# Patient Record
Sex: Male | Born: 1960 | Race: White | Hispanic: No | Marital: Single
Health system: Southern US, Community
[De-identification: ages and names within clinical notes are randomized; demographics above are authoritative.]

## PROBLEM LIST (undated history)

## (undated) DIAGNOSIS — G7102 Facioscapulohumeral muscular dystrophy: Secondary | ICD-10-CM

---

## 2008-08-19 ENCOUNTER — Emergency Department (HOSPITAL_COMMUNITY): Admission: EM | Admit: 2008-08-19 | Discharge: 2008-08-19 | Payer: Self-pay | Admitting: Emergency Medicine

## 2008-08-19 ENCOUNTER — Ambulatory Visit: Payer: Self-pay | Admitting: Vascular Surgery

## 2008-08-19 ENCOUNTER — Encounter (INDEPENDENT_AMBULATORY_CARE_PROVIDER_SITE_OTHER): Payer: Self-pay | Admitting: Emergency Medicine

## 2009-05-12 ENCOUNTER — Inpatient Hospital Stay (HOSPITAL_COMMUNITY): Admission: EM | Admit: 2009-05-12 | Discharge: 2009-05-15 | Payer: Self-pay | Admitting: Emergency Medicine

## 2009-05-15 ENCOUNTER — Ambulatory Visit: Payer: Self-pay | Admitting: Vascular Surgery

## 2009-05-15 ENCOUNTER — Encounter (INDEPENDENT_AMBULATORY_CARE_PROVIDER_SITE_OTHER): Payer: Self-pay | Admitting: Emergency Medicine

## 2009-05-15 ENCOUNTER — Encounter (INDEPENDENT_AMBULATORY_CARE_PROVIDER_SITE_OTHER): Payer: Self-pay | Admitting: Surgery

## 2009-05-23 ENCOUNTER — Encounter (HOSPITAL_BASED_OUTPATIENT_CLINIC_OR_DEPARTMENT_OTHER): Admission: RE | Admit: 2009-05-23 | Discharge: 2009-07-31 | Payer: Self-pay | Admitting: Internal Medicine

## 2010-08-13 LAB — CULTURE, BLOOD (ROUTINE X 2)
Culture: NO GROWTH
Culture: NO GROWTH

## 2010-08-13 LAB — WOUND CULTURE: Gram Stain: NONE SEEN

## 2010-08-13 LAB — COMPREHENSIVE METABOLIC PANEL
ALT: 38 U/L (ref 0–53)
AST: 44 U/L — ABNORMAL HIGH (ref 0–37)
Albumin: 4.4 g/dL (ref 3.5–5.2)
Calcium: 9.1 mg/dL (ref 8.4–10.5)
Creatinine, Ser: <0.3 mg/dL — ABNORMAL LOW (ref 0.4–1.5)
Potassium: 3.7 mEq/L (ref 3.5–5.1)
Total Protein: 8.1 g/dL (ref 6.0–8.3)

## 2010-08-13 LAB — URINALYSIS, ROUTINE W REFLEX MICROSCOPIC
Bilirubin Urine: NEGATIVE
Glucose, UA: NEGATIVE mg/dL
Protein, ur: NEGATIVE mg/dL
Specific Gravity, Urine: 1.021 (ref 1.005–1.030)
Urobilinogen, UA: 0.2 mg/dL (ref 0.0–1.0)
pH: 5.5 (ref 5.0–8.0)

## 2010-08-13 LAB — CBC
MCHC: 34 g/dL (ref 30.0–36.0)
RBC: 4.81 MIL/uL (ref 4.22–5.81)
WBC: 8.9 10*3/uL (ref 4.0–10.5)

## 2010-08-13 LAB — DIFFERENTIAL
Basophils Absolute: 0.1 10*3/uL (ref 0.0–0.1)
Eosinophils Absolute: 0.1 10*3/uL (ref 0.0–0.7)
Eosinophils Relative: 1 % (ref 0–5)
Lymphocytes Relative: 19 % (ref 12–46)
Monocytes Absolute: 0.6 10*3/uL (ref 0.1–1.0)
Monocytes Relative: 6 % (ref 3–12)
Neutrophils Relative %: 73 % (ref 43–77)

## 2011-01-05 IMAGING — CR DG TIBIA/FIBULA 2V*R*
4 series · 4 of 4 positions shown · non-contrast
Comparison: None.

CLINICAL DATA: Infected sore in the medial right leg

RIGHT TIBIA AND FIBULA - 2 VIEW

[t tib/fib ap right (1 of 2)]
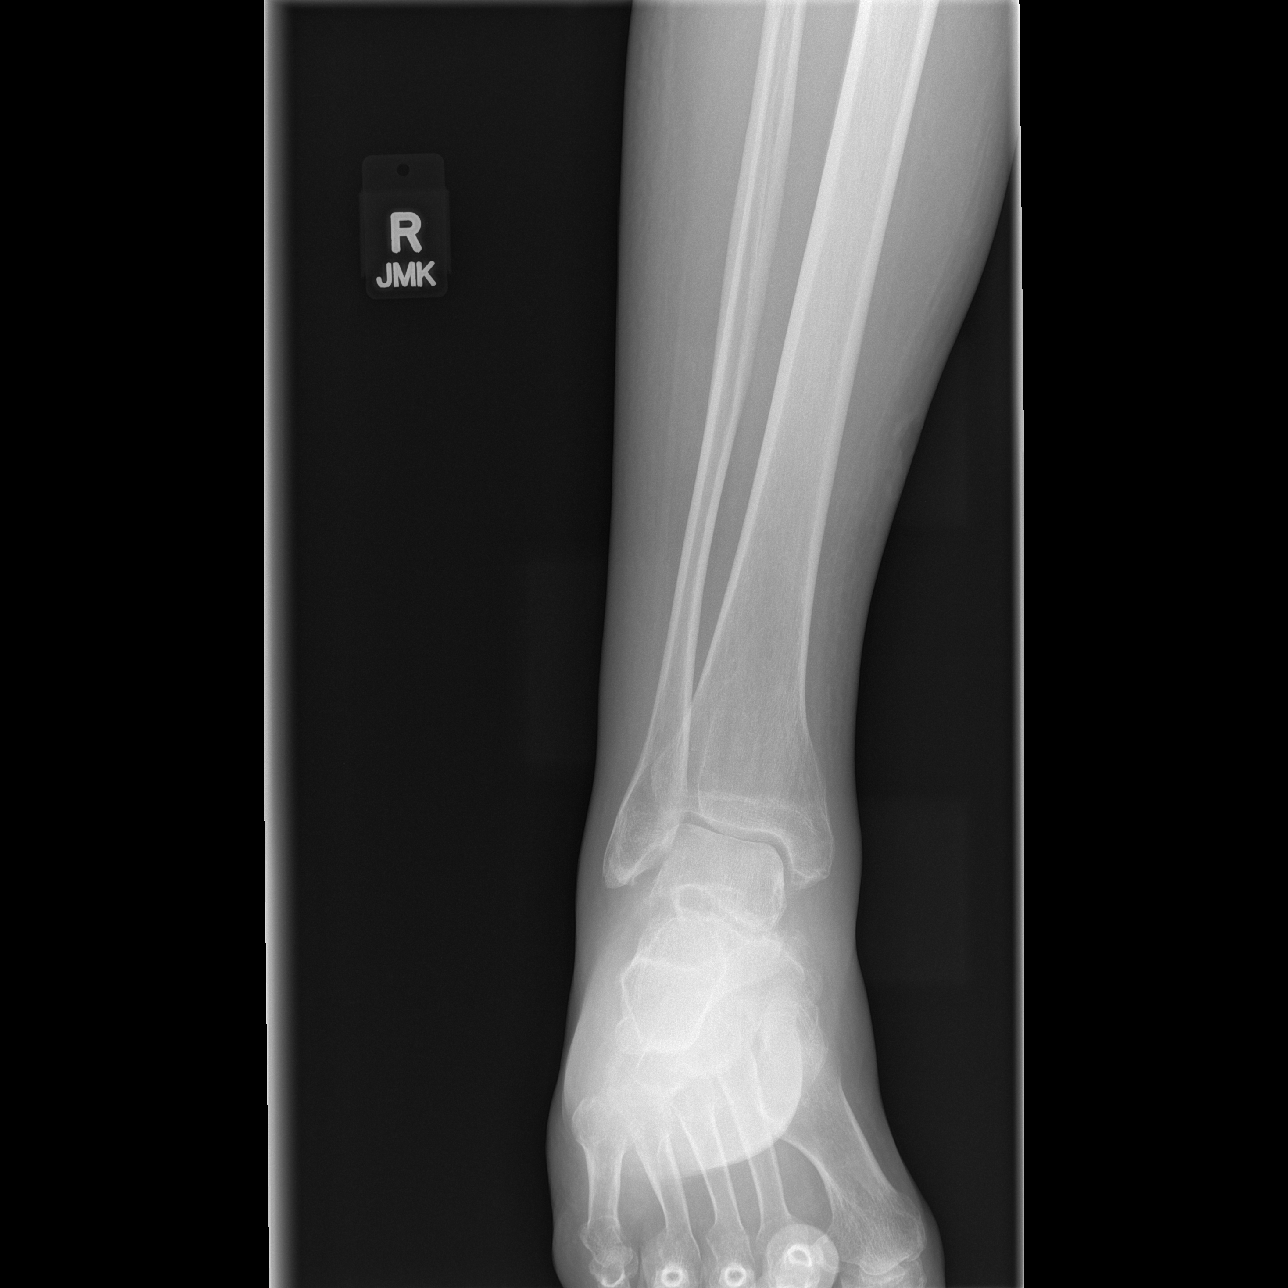

[t tib/fib ap right (2 of 2)]
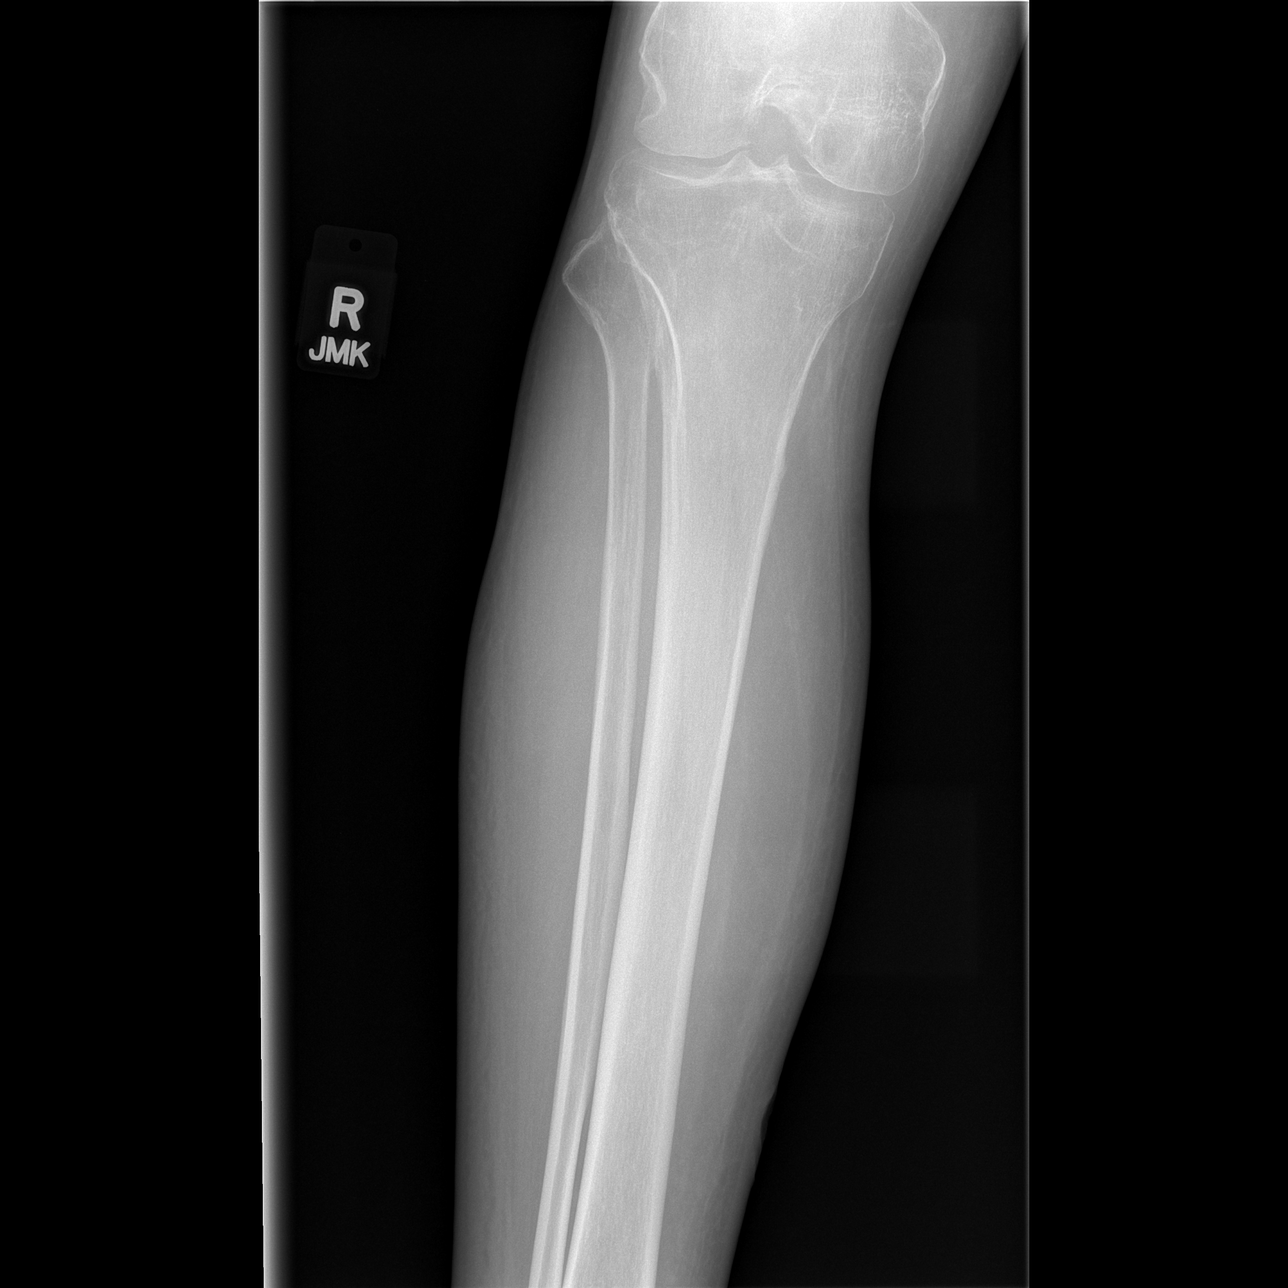

[t tib/fib lat right (1 of 2)]
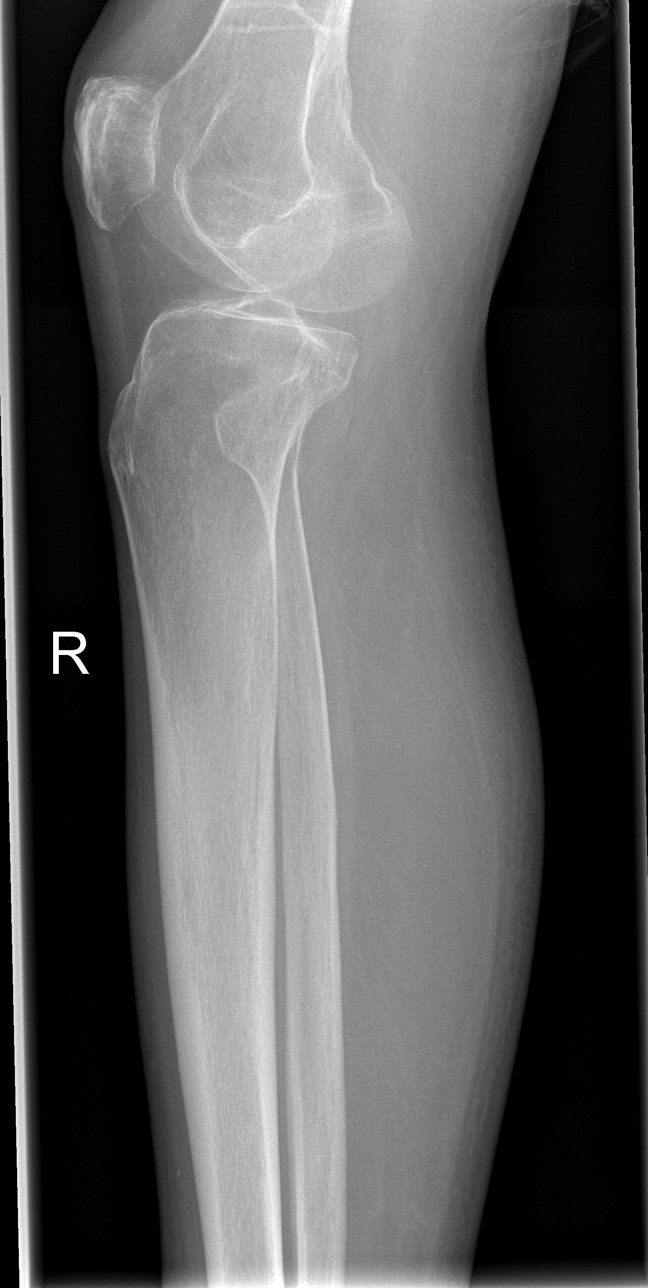

[t tib/fib lat right (2 of 2)]
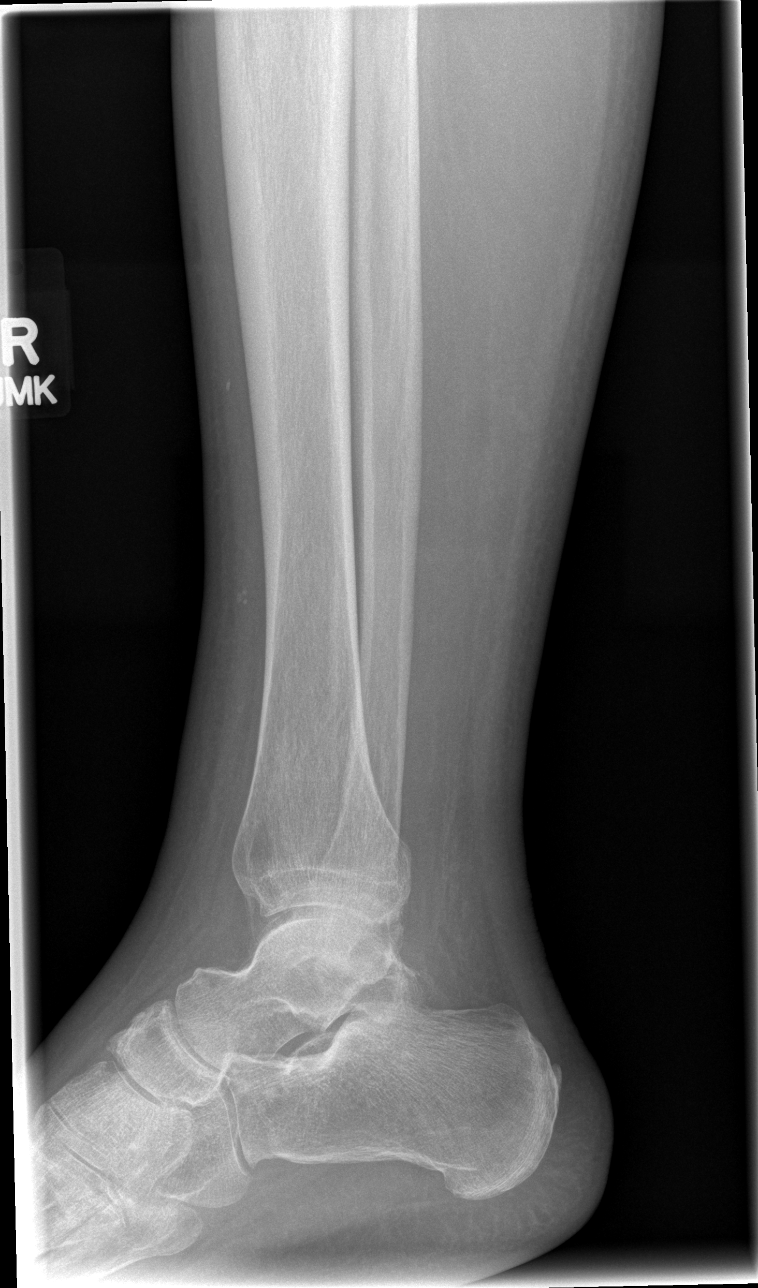

[4 of 4 positions shown; findings below may reference images not displayed]

FINDINGS: Two-view exam of the right tibia and fibula shows diffuse
osseous demineralization.  No lytic or sclerotic abnormality.  No
evidence for fracture.
IMPRESSION: No changes to suggest underlying osteomyelitis of the tibia or
fibula.

## 2019-04-28 ENCOUNTER — Inpatient Hospital Stay (HOSPITAL_COMMUNITY)
Admission: EM | Admit: 2019-04-28 | Discharge: 2019-05-14 | DRG: 871 | Disposition: E | Payer: Medicare Other | Attending: Critical Care Medicine | Admitting: Critical Care Medicine

## 2019-04-28 ENCOUNTER — Encounter (HOSPITAL_COMMUNITY): Payer: Self-pay | Admitting: Emergency Medicine

## 2019-04-28 ENCOUNTER — Other Ambulatory Visit: Payer: Self-pay

## 2019-04-28 ENCOUNTER — Emergency Department (HOSPITAL_COMMUNITY): Payer: Medicare Other

## 2019-04-28 DIAGNOSIS — E860 Dehydration: Secondary | ICD-10-CM | POA: Diagnosis present

## 2019-04-28 DIAGNOSIS — T68XXXA Hypothermia, initial encounter: Secondary | ICD-10-CM

## 2019-04-28 DIAGNOSIS — A419 Sepsis, unspecified organism: Secondary | ICD-10-CM | POA: Diagnosis not present

## 2019-04-28 DIAGNOSIS — R6520 Severe sepsis without septic shock: Secondary | ICD-10-CM | POA: Diagnosis not present

## 2019-04-28 DIAGNOSIS — Z6825 Body mass index (BMI) 25.0-25.9, adult: Secondary | ICD-10-CM

## 2019-04-28 DIAGNOSIS — I4891 Unspecified atrial fibrillation: Secondary | ICD-10-CM | POA: Diagnosis present

## 2019-04-28 DIAGNOSIS — D6959 Other secondary thrombocytopenia: Secondary | ICD-10-CM | POA: Diagnosis present

## 2019-04-28 DIAGNOSIS — J9612 Chronic respiratory failure with hypercapnia: Secondary | ICD-10-CM | POA: Diagnosis present

## 2019-04-28 DIAGNOSIS — L03116 Cellulitis of left lower limb: Secondary | ICD-10-CM | POA: Diagnosis present

## 2019-04-28 DIAGNOSIS — Z66 Do not resuscitate: Secondary | ICD-10-CM | POA: Diagnosis present

## 2019-04-28 DIAGNOSIS — Z515 Encounter for palliative care: Secondary | ICD-10-CM | POA: Diagnosis not present

## 2019-04-28 DIAGNOSIS — E162 Hypoglycemia, unspecified: Secondary | ICD-10-CM | POA: Diagnosis present

## 2019-04-28 DIAGNOSIS — E870 Hyperosmolality and hypernatremia: Secondary | ICD-10-CM | POA: Diagnosis present

## 2019-04-28 DIAGNOSIS — J189 Pneumonia, unspecified organism: Secondary | ICD-10-CM | POA: Diagnosis present

## 2019-04-28 DIAGNOSIS — N179 Acute kidney failure, unspecified: Secondary | ICD-10-CM | POA: Diagnosis present

## 2019-04-28 DIAGNOSIS — S80812A Abrasion, left lower leg, initial encounter: Secondary | ICD-10-CM | POA: Diagnosis present

## 2019-04-28 DIAGNOSIS — R627 Adult failure to thrive: Secondary | ICD-10-CM | POA: Diagnosis present

## 2019-04-28 DIAGNOSIS — R6521 Severe sepsis with septic shock: Secondary | ICD-10-CM | POA: Diagnosis present

## 2019-04-28 DIAGNOSIS — D696 Thrombocytopenia, unspecified: Secondary | ICD-10-CM

## 2019-04-28 DIAGNOSIS — N3000 Acute cystitis without hematuria: Secondary | ICD-10-CM | POA: Diagnosis present

## 2019-04-28 DIAGNOSIS — G7102 Facioscapulohumeral muscular dystrophy: Secondary | ICD-10-CM | POA: Diagnosis present

## 2019-04-28 DIAGNOSIS — E43 Unspecified severe protein-calorie malnutrition: Secondary | ICD-10-CM | POA: Diagnosis present

## 2019-04-28 DIAGNOSIS — Z20828 Contact with and (suspected) exposure to other viral communicable diseases: Secondary | ICD-10-CM | POA: Diagnosis present

## 2019-04-28 DIAGNOSIS — B952 Enterococcus as the cause of diseases classified elsewhere: Secondary | ICD-10-CM | POA: Diagnosis present

## 2019-04-28 DIAGNOSIS — R069 Unspecified abnormalities of breathing: Secondary | ICD-10-CM

## 2019-04-28 DIAGNOSIS — Z993 Dependence on wheelchair: Secondary | ICD-10-CM

## 2019-04-28 DIAGNOSIS — L03115 Cellulitis of right lower limb: Secondary | ICD-10-CM | POA: Diagnosis present

## 2019-04-28 DIAGNOSIS — J9601 Acute respiratory failure with hypoxia: Secondary | ICD-10-CM | POA: Diagnosis not present

## 2019-04-28 HISTORY — DX: Facioscapulohumeral muscular dystrophy: G71.02

## 2019-04-28 LAB — COMPREHENSIVE METABOLIC PANEL
ALT: 77 U/L — ABNORMAL HIGH (ref 0–44)
AST: 91 U/L — ABNORMAL HIGH (ref 15–41)
Albumin: 3.4 g/dL — ABNORMAL LOW (ref 3.5–5.0)
Alkaline Phosphatase: 166 U/L — ABNORMAL HIGH (ref 38–126)
Anion gap: 12 (ref 5–15)
BUN: 42 mg/dL — ABNORMAL HIGH (ref 6–20)
CO2: 33 mmol/L — ABNORMAL HIGH (ref 22–32)
Calcium: 10.1 mg/dL (ref 8.9–10.3)
Chloride: 104 mmol/L (ref 98–111)
Creatinine, Ser: 0.46 mg/dL — ABNORMAL LOW (ref 0.61–1.24)
GFR calc Af Amer: >60 mL/min (ref >60)
GFR calc non Af Amer: >60 mL/min (ref >60)
Glucose, Bld: 75 mg/dL (ref 70–99)
Potassium: 4.6 mmol/L (ref 3.5–5.1)
Sodium: 149 mmol/L — ABNORMAL HIGH (ref 135–145)
Total Bilirubin: 1.4 mg/dL — ABNORMAL HIGH (ref 0.3–1.2)
Total Protein: 7.7 g/dL (ref 6.5–8.1)

## 2019-04-28 LAB — CBC WITH DIFFERENTIAL/PLATELET
Abs Immature Granulocytes: 0.01 10*3/uL (ref 0.00–0.07)
Basophils Absolute: 0 10*3/uL (ref 0.0–0.1)
Basophils Relative: 0 %
Eosinophils Absolute: 0 10*3/uL (ref 0.0–0.5)
Eosinophils Relative: 0 %
HCT: 50.5 % (ref 39.0–52.0)
Hemoglobin: 15.5 g/dL (ref 13.0–17.0)
Immature Granulocytes: 0 %
Lymphocytes Relative: 10 %
Lymphs Abs: 0.4 10*3/uL — ABNORMAL LOW (ref 0.7–4.0)
MCH: 31.1 pg (ref 26.0–34.0)
MCHC: 30.7 g/dL (ref 30.0–36.0)
MCV: 101.2 fL — ABNORMAL HIGH (ref 80.0–100.0)
Monocytes Absolute: 0.3 10*3/uL (ref 0.1–1.0)
Monocytes Relative: 7 %
Neutro Abs: 3.7 10*3/uL (ref 1.7–7.7)
Neutrophils Relative %: 83 %
Platelets: 29 10*3/uL — CL (ref 150–400)
RBC: 4.99 MIL/uL (ref 4.22–5.81)
RDW: 18.6 % — ABNORMAL HIGH (ref 11.5–15.5)
WBC: 4.4 10*3/uL (ref 4.0–10.5)
nRBC: 0.5 % — ABNORMAL HIGH (ref 0.0–0.2)

## 2019-04-28 LAB — URINALYSIS, ROUTINE W REFLEX MICROSCOPIC
Bilirubin Urine: NEGATIVE
Glucose, UA: NEGATIVE mg/dL
Ketones, ur: NEGATIVE mg/dL
Nitrite: NEGATIVE
Protein, ur: 30 mg/dL — AB
Specific Gravity, Urine: 1.016 (ref 1.005–1.030)
pH: 5 (ref 5.0–8.0)

## 2019-04-28 LAB — TSH: TSH: 3.002 u[IU]/mL (ref 0.350–4.500)

## 2019-04-28 LAB — POC SARS CORONAVIRUS 2 AG -  ED: SARS Coronavirus 2 Ag: NEGATIVE

## 2019-04-28 LAB — PROTIME-INR
INR: 1 (ref 0.8–1.2)
Prothrombin Time: 12.7 seconds (ref 11.4–15.2)

## 2019-04-28 LAB — IMMATURE PLATELET FRACTION: Immature Platelet Fraction: 15 % — ABNORMAL HIGH (ref 1.2–8.6)

## 2019-04-28 LAB — LACTIC ACID, PLASMA: Lactic Acid, Venous: 1.4 mmol/L (ref 0.5–1.9)

## 2019-04-28 LAB — APTT: aPTT: 41 seconds — ABNORMAL HIGH (ref 24–36)

## 2019-04-28 LAB — CK: Total CK: 129 U/L (ref 49–397)

## 2019-04-28 MED ORDER — LACTATED RINGERS IV BOLUS (SEPSIS)
1000.0000 mL | Freq: Once | INTRAVENOUS | Status: AC
  Administered 2019-04-28: 18:00:00 1000 mL via INTRAVENOUS

## 2019-04-28 MED ORDER — IOHEXOL 300 MG/ML  SOLN
100.0000 mL | Freq: Once | INTRAMUSCULAR | Status: AC | PRN
  Administered 2019-04-28: 100 mL via INTRAVENOUS

## 2019-04-28 MED ORDER — SODIUM CHLORIDE 0.9 % IV SOLN
2.0000 g | Freq: Once | INTRAVENOUS | Status: AC
  Administered 2019-04-28: 2 g via INTRAVENOUS
  Filled 2019-04-28: qty 20

## 2019-04-28 MED ORDER — LACTATED RINGERS IV BOLUS (SEPSIS)
1000.0000 mL | Freq: Once | INTRAVENOUS | Status: AC
  Administered 2019-04-28: 1000 mL via INTRAVENOUS

## 2019-04-28 MED ORDER — LACTATED RINGERS IV BOLUS (SEPSIS)
500.0000 mL | Freq: Once | INTRAVENOUS | Status: AC
  Administered 2019-04-28: 500 mL via INTRAVENOUS

## 2019-04-28 MED ORDER — SODIUM CHLORIDE (PF) 0.9 % IJ SOLN
INTRAMUSCULAR | Status: AC
  Filled 2019-04-28: qty 50

## 2019-04-28 MED ORDER — LACTATED RINGERS IV BOLUS
1000.0000 mL | Freq: Once | INTRAVENOUS | Status: AC
  Administered 2019-04-28: 1000 mL via INTRAVENOUS

## 2019-04-28 MED ORDER — HYDROCORTISONE NA SUCCINATE PF 100 MG IJ SOLR
100.0000 mg | Freq: Once | INTRAMUSCULAR | Status: AC
  Administered 2019-04-28: 100 mg via INTRAVENOUS
  Filled 2019-04-28: qty 2

## 2019-04-28 MED ORDER — VANCOMYCIN HCL IN DEXTROSE 1-5 GM/200ML-% IV SOLN
1000.0000 mg | Freq: Once | INTRAVENOUS | Status: AC
  Administered 2019-04-28: 19:00:00 1000 mg via INTRAVENOUS
  Filled 2019-04-28: qty 200

## 2019-04-28 NOTE — ED Provider Notes (Signed)
[facility]-EMERGENCY DEPT Provider Note   CSN: [id number] Arrival date & time: 2019-05-05  1703     History Chief Complaint  Patient presents with  . Weakness    Dustin Wong is a 58 y.o. male.  HPI    Patient presents to the emergency room for evaluation of decreasing appetite and failure to thrive.  According to the EMS report the patient has advanced muscular dystrophy.  He is primarily chair bound.  According to family members the patient's had decreasing appetite.  He was looking more listless.  They called EMS.  EMS noted that the patient was hypotensive.  Patient appears is very ill and emaciated.  He does nod his head and answer my questions.  He denies having any pain.  He denies having any complaints.    past medical history: Advanced muscular dystrophy   History reviewed. No pertinent surgical history.     History reviewed. No pertinent family history.  Social History   Tobacco Use  . Smoking status: Not on file  Substance Use Topics  . Alcohol use: Not on file  . Drug use: Not on file    Home Medications Prior to Admission medications   Medication Sig Start Date End Date Taking? Authorizing Provider  Bioflavonoid Products (VITAMIN C PLUS) 500 MG TABS Take 500 mg by mouth daily.   Yes [provider]  cholecalciferol (VITAMIN D3) 25 MCG (1000 UT) tablet Take 1,000 Units by mouth daily.   Yes [provider]  furosemide (LASIX) 40 MG tablet Take 40 mg by mouth.   Yes [provider]  Multiple Vitamin (MULTIVITAMIN WITH MINERALS) TABS tablet Take 1 tablet by mouth daily.   Yes [provider]  potassium chloride (KLOR-CON) 10 MEQ tablet Take 10 mEq by mouth daily. 04/19/19  Yes [provider]    Allergies    Patient has no known allergies.  Review of Systems   Review of Systems  Unable to perform ROS: Acuity of condition    Physical Exam Updated Vital Signs BP (!) 83/56 (BP Location:  Right Arm)   Pulse 99   Temp (!) 90 F (32.2 C)   Resp (!) 29   SpO2 96%   Physical Exam Vitals and nursing note reviewed.  Constitutional:      Appearance: He is well-developed. He is ill-appearing.  HENT:     Head: Normocephalic and atraumatic.     Right Ear: External ear normal.     Left Ear: External ear normal.  Eyes:     General: No scleral icterus.       Right eye: No discharge.        Left eye: No discharge.     Conjunctiva/sclera: Conjunctivae normal.     Comments: Conjunctival injection bilaterally, eyes proptotic  Neck:     Trachea: No tracheal deviation.  Cardiovascular:     Rate and Rhythm: Normal rate and regular rhythm.  Pulmonary:     Effort: Pulmonary effort is normal. No respiratory distress.     Breath sounds: Normal breath sounds. No stridor. No wheezing or rales.  Abdominal:     General: Bowel sounds are normal. There is no distension.     Palpations: Abdomen is soft.     Tenderness: There is no abdominal tenderness. There is no guarding or rebound.     Comments: Firm ?mass lower abdomen  Genitourinary:    Penis: Normal.      Testes: Normal.  Musculoskeletal:  General: No tenderness.     Cervical back: Neck supple.     Right lower leg: Edema present.     Left lower leg: Edema present.     Comments: Significant edema bilateral lower extremities, weeping ulcerative wounds in the lower extremities with crusting on the skin, erythema of the bilateral lower extremities; edema extends into the buttocks  Skin:    General: Skin is warm and dry.     Findings: No rash.  Neurological:     Mental Status: He is alert.     Cranial Nerves: Cranial nerve deficit: Patient has difficulty smiling, unable to assess for facial droop, patient's not speaking to me when answer questions      Motor: Weakness, atrophy and abnormal muscle tone present. No seizure activity.     Coordination: Coordination normal.     Comments: Weakness in all 4 extremities     ED  Results / Procedures / Treatments   Labs (all labs ordered are listed, but only abnormal results are displayed) Labs Reviewed  COMPREHENSIVE METABOLIC PANEL - Abnormal; Notable for the following components:      Result Value   Sodium 149 (*)    CO2 33 (*)    BUN 42 (*)    Creatinine, Ser 0.46 (*)    Albumin 3.4 (*)    AST 91 (*)    ALT 77 (*)    Alkaline Phosphatase 166 (*)    Total Bilirubin 1.4 (*)    All other components within normal limits  CBC WITH DIFFERENTIAL/PLATELET - Abnormal; Notable for the following components:   MCV 101.2 (*)    RDW 18.6 (*)    Platelets 29 (*)    nRBC 0.5 (*)    Lymphs Abs 0.4 (*)    All other components within normal limits  APTT - Abnormal; Notable for the following components:   aPTT 41 (*)    All other components within normal limits  URINALYSIS, ROUTINE W REFLEX MICROSCOPIC - Abnormal; Notable for the following components:   Color, Urine AMBER (*)    APPearance HAZY (*)    Hgb urine dipstick MODERATE (*)    Protein, ur 30 (*)    Leukocytes,Ua LARGE (*)    Bacteria, UA FEW (*)    All other components within normal limits  IMMATURE PLATELET FRACTION - Abnormal; Notable for the following components:   Immature Platelet Fraction 15.0 (*)    All other components within normal limits  CULTURE, BLOOD (ROUTINE X 2)  CULTURE, BLOOD (ROUTINE X 2)  URINE CULTURE  SARS CORONAVIRUS 2 (TAT 6-24 HRS)  LACTIC ACID, PLASMA  PROTIME-INR  CK  TSH  LACTIC ACID, PLASMA  POC SARS CORONAVIRUS 2 AG -  ED    EKG None  Radiology CT ABDOMEN PELVIS W CONTRAST  Result Date: 04/24/2019 CLINICAL DATA:  Elevated LFTs, sepsis EXAM: CT ABDOMEN AND PELVIS WITH CONTRAST TECHNIQUE: Multidetector CT imaging of the abdomen and pelvis was performed using the standard protocol following bolus administration of intravenous contrast. CONTRAST:  100mL OMNIPAQUE IOHEXOL 300 MG/ML  SOLN COMPARISON:  None. FINDINGS: Lower chest: Airspace disease in the left lower lobe  concerning for pneumonia. Small right pleural effusion. Hepatobiliary: Diffuse low-density throughout the liver compatible with fatty infiltration. No focal abnormality. Gallbladder unremarkable. Pancreas: No focal abnormality or ductal dilatation. Spleen: No focal abnormality.  Normal size. Adrenals/Urinary Tract: The right upper pole collecting system is mildly prominent likely related to the staghorn calculus. Ureters are decompressed. Foley catheter in  place with urinary bladder decompressed. Stomach/Bowel: Stomach, large and small bowel grossly unremarkable. Vascular/Lymphatic: Aortic atherosclerosis. No enlarged abdominal or pelvic lymph nodes. Reproductive: No visible focal abnormality. Other: No free fluid or free air. Musculoskeletal: No acute bony abnormality. Advanced degenerative facet disease in the lumbar spine. IMPRESSION: Small right pleural effusion. Airspace disease in the left lower lobe concerning for pneumonia. Fatty infiltration of the liver. Right renal pelvic staghorn calculus. Caliectasis in the upper pole of the right kidney. Ureters are decompressed. Aortic atherosclerosis. Electronically Signed   By: Charlett Nose M.D.   On: 2019-05-25 20:09   DG Chest Port 1 View  Result Date: 05-25-2019 CLINICAL DATA:  Weakness EXAM: PORTABLE CHEST 1 VIEW COMPARISON:  None. FINDINGS: Heart size and vascularity normal. Lungs are clear. Patient rotated. Subluxation right shoulder may be chronic.  Shallow glenoid fossa. IMPRESSION: No active disease. Electronically Signed   By: Marlan Palau M.D.   On: 2019/05/25 18:37    Procedures .Critical Care Performed by: Linwood Dibbles, MD Authorized by: Linwood Dibbles, MD   Critical care provider statement:    Critical care time (minutes):  45   Critical care was time spent personally by me on the following activities:  Discussions with consultants, evaluation of patient's response to treatment, examination of patient, ordering and performing treatments and  interventions, ordering and review of laboratory studies, ordering and review of radiographic studies, pulse oximetry, re-evaluation of patient's condition, obtaining history from patient or surrogate and review of old charts   (including critical care time)  Medications Ordered in ED Medications  lactated ringers bolus 1,000 mL (0 mLs Intravenous Stopped 05-25-19 1905)    And  lactated ringers bolus 1,000 mL (1,000 mLs Intravenous New Bag/Given 05/25/19 1905)    And  lactated ringers bolus 500 mL (500 mLs Intravenous New Bag/Given 05/25/2019 1909)  vancomycin (VANCOCIN) IVPB 1000 mg/200 mL premix (0 mg Intravenous Stopped 25-May-2019 2040)  cefTRIAXone (ROCEPHIN) 2 g in sodium chloride 0.9 % 100 mL IVPB (0 g Intravenous Stopped May 25, 2019 1858)  sodium chloride (PF) 0.9 % injection (  Given by Other May 25, 2019 2007)  iohexol (OMNIPAQUE) 300 MG/ML solution 100 mL (100 mLs Intravenous Contrast Given 05/25/2019 1937)    ED Course  I have reviewed the triage vital signs and the nursing notes.  Pertinent labs & imaging results that were available during my care of the patient were reviewed by me and considered in my medical decision making (see chart for details).  Clinical Course as of Apr 27 2116  Wed 25-May-2019  1741 Attempted to contact patient contact.  No answer and unable to leave a message because the memory was full   [JK]  1858 Patient's initial CBC is notable for thrombocytopenia   [JK]  1859 Urinalysis consistent with a urinary tract infection.   [JK]  1859 Electrolyte panel notable for elevated LFTs as well as increased BUN and creatinine and hyponatremia.  Consistent with dehydration   [JK]  1933 Therapeutic range  TSH [JK]  2010 Patient remains hypothermic.  He is getting warmed IV fluids and is on a Lawyer.  Blood pressure remains borderline low.  Lactic acid levels not elevated but his symptoms are concerning for sepsis.   [JK]  2037 CT scan findings reviewed.  Total  source of intra-abdominal infection.  Possible pneumonia.   [JK]  2042 Albumin(!): 3.4 [JK]  2050 Discussed with PCCM, Dr Vaughan Basta, [facility] will consult in the ED to  determine appropriate admitting service.   [  JK]  2115 Disucssed with Dr Burr Medico.  Will consult on pt in the am   [JK]    Clinical Course User Index [JK] Dorie Rank, MD   MDM Rules/Calculators/A&P                     Patient has advanced muscular dystrophy.  On exam he had significantly edematous lower legs with serous fluid weeping and crusting debris bilaterally.  Patient's ED evaluation notable for urinary tract infection.  He had signs of sepsis with hypothermia and hypotension.  Lactic acid level was not elevated.  No signs of hypothyroidism based on his TSH.  Thrombocytopenia is also noted unclear what is the cause for this.  ?ITP.  Will consult with hematology.  Consult with PCCM vs hospitalist for admission. Final Clinical Impression(s) / ED Diagnoses Final diagnoses:  Sepsis, due to unspecified organism, unspecified whether acute organ dysfunction present ([place])  Hypothermia, initial encounter  Acute cystitis without hematuria      Dorie Rank, MD 04/19/2019 2117

## 2019-04-28 NOTE — ED Notes (Signed)
Patient's gold cross necklace is in locker. Key to locker and paperwork at patient bedside with clothing.

## 2019-04-28 NOTE — Consult Note (Addendum)
NAME:  Dustin Wong, MRN:  [id number], DOB:  [birth year], LOS: 0 ADMISSION DATE:  04/14/2019, CONSULTATION DATE:  04/24/2019 REFERRING MD:  ED, CHIEF COMPLAINT:  Altered mental status, poor appetitse  Brief History   58 year old man with muscular dystrophy (diagnosed in adulthood), here with FTT, acute worsening of mental status.  Weakness x 1 day, not able to work his wheelchair anymore, minimal urine output, no longer speaking, not eating.   History of present illness    Advanced muscular dystrophy, FTT, decreasing appetite, hypotensive.   Brought by family for decreasing appetite.   Actually lives alone, normally able to do simple things for himself, has mother and sister who are with him for a lot of the day.  Chronic LE edema, increasing erythema and weeping from BLE.   He does not like leaving his house, has resisted going to the doctor for a long time, per his sister.   Cr up to 0.46 from prior <0.3 (2010)  In ED has received 1.2 L bolus at the time of my exam.    Past Medical History  Muscular dystrophy BLE edema chronically  Significant Hospital Events     Consults:    Procedures:    Significant Diagnostic Tests:  CT chest and Abd: LLL consolidation  Micro Data:    Antimicrobials:  Ceftriaxone Vancomycin  Interim history/subjective:    Objective   Blood pressure (!) 83/56, pulse 99, temperature (!) 90 F (32.2 C), resp. rate (!) 29, SpO2 96 %.        Intake/Output Summary (Last 24 hours) at 05/10/2019 2111 Last data filed at 04/13/2019 2040 Gross per 24 hour  Intake 200 ml  Output --  Net 200 ml   There were no vitals filed for this visit.  Examination: General: Awake, non verbal, says yes and no.  HENT: erythematous film over both eyes. (chronic per sister),  Thin, erythematous lower lip,  Lungs: CTAB Cardiovascular: sinus tachycardic, frequent PACs Abdomen: NT,ND, NBS Extremities: severe B edema, up to knees, erythema, thickened skin,  multiple eschar and wounds in various stages of healing.  Neuro: awake, not speaking clearly (normally able to speak per sister) GU: purulent appearing urine in foley [facility] Problem list     Assessment & Plan:  Hypotension, AMS, weakness : sepsis.  Hypothermic. appears to have a UTI, as well as LLL PNA on CT.  Cont antibiotics.  No pressors needed at the time of my exam, responding to fluids.  Still appears dry on my exam.  Cont fluids. (100cc ns per hr) hypoalb 3.4 LFTs slightly up.  Thrombocytopenia - Heme consulting. PT and INR nl.  fibrinogen, retics, d dimer pending.  MD - progression of disease, FTT.  Suspect depression could be component of FTT.   Best practice:  Diet: NPO, can eat if passes swallow eval.   Pain/Anxiety/Delirium protocol (if indicated):   VAP protocol (if indicated):  DVT prophylaxis: none due to thrombocytopenia GI prophylaxis: ppi Glucose control: prn Mobility: bed.  Normally sits and sleeps in his wheelchair.  Code Status: Discussed with his sister.  She is not sure he would want cpr and intubation.  He is unable to clearly show that he understands and can make that decision.  Talked about risks vs benefits of mechanical vent and cpr.  I advised that DNR/DNI would be appropriate.  [name] will discuss with her mother and let us know tomorrow.  Family Communication: Sister [name] very pleasant and at bedside.  Disposition: ICU  Labs   CBC: Recent Labs  Lab 04/23/2019 1736  WBC 4.4  NEUTROABS 3.7  HGB 15.5  HCT 50.5  MCV 101.2*  PLT 29*    Basic Metabolic Panel: Recent Labs  Lab 04/15/2019 1736  NA 149*  K 4.6  CL 104  CO2 33*  GLUCOSE 75  BUN 42*  CREATININE 0.46*  CALCIUM 10.1   GFR: CrCl cannot be calculated (Unknown ideal weight.). Recent Labs  Lab 04/21/2019 1736  WBC 4.4  LATICACIDVEN 1.4    Liver Function Tests: Recent Labs  Lab 04/21/2019 1736  AST 91*  ALT 77*  ALKPHOS 166*  BILITOT 1.4*  PROT 7.7   ALBUMIN 3.4*   No results for input(s): LIPASE, AMYLASE in the last 168 hours. No results for input(s): AMMONIA in the last 168 hours.  ABG No results found for: PHART, PCO2ART, PO2ART, HCO3, TCO2, ACIDBASEDEF, O2SAT   Coagulation Profile: Recent Labs  Lab 05/09/2019 1736  INR 1.0    Cardiac Enzymes: Recent Labs  Lab 04/23/2019 1736  CKTOTAL 129    HbA1C: No results found for: HGBA1C  CBG: No results for input(s): GLUCAP in the last 168 hours.  Review of Systems:   Unable to assess, non verbal now  Past Medical History  He,  has no past medical history on file.   Surgical History   History reviewed. No pertinent surgical history.   Social History      Family History   His family history is not on file.   Allergies No Known Allergies   Home Medications  Prior to Admission medications   Medication Sig Start Date End Date Taking? Authorizing Provider  Bioflavonoid Products (VITAMIN C PLUS) 500 MG TABS Take 500 mg by mouth daily.   Yes [provider]  cholecalciferol (VITAMIN D3) 25 MCG (1000 UT) tablet Take 1,000 Units by mouth daily.   Yes [provider]  furosemide (LASIX) 40 MG tablet Take 40 mg by mouth.   Yes [provider]  Multiple Vitamin (MULTIVITAMIN WITH MINERALS) TABS tablet Take 1 tablet by mouth daily.   Yes [provider]  potassium chloride (KLOR-CON) 10 MEQ tablet Take 10 mEq by mouth daily. 04/19/19  Yes [provider]     Critical care time: 60 min

## 2019-04-28 NOTE — ED Notes (Signed)
Writer has spoken with mother Hiram Mciver and sister Daryus Sowash, Ms Daniel Nones is coming to the hospital to be with her brother. She will bring a hand written [facility] POA that pt wrote on Dec 12th although it has not been notarized along with his insurance information. She is aware to ask for Anderson Malta RN CN when she arrives.

## 2019-04-28 NOTE — ED Notes (Signed)
Per discussion with charge RN, patient's sister Henrietta Dine) will arrive and come back to see patient around 49.

## 2019-04-28 NOTE — Progress Notes (Signed)
A consult was received from an ED physician for vancomycin per pharmacy dosing.  The patient's profile has been reviewed for ht/wt/allergies/indication/available labs.    Order entered for ht/wt. No recent weight available in chart.   A one time order has been placed for vancomycin 1000 mg IV once.    Further antibiotics/pharmacy consults should be ordered by admitting physician if indicated.                       Thank you, Lenis Noon, PharmD 05/08/2019  5:57 PM

## 2019-04-28 NOTE — Sepsis Progress Note (Signed)
Notified bedside nurse of need to administer antibiotics.  Pt is difficult stick for blood cx. IV's do not draw back. One blood cx was obtained and she is hanging rocephin now for no further delay

## 2019-04-28 NOTE — ED Triage Notes (Signed)
Pt BIB EMS.  Pt from home with his sister (POA).  Advanced muscular dystrophy.  Family reports FTT.  Decreasing appetite.  Hypotensive en route.  This is the first time he has laid down in 4 years (reports family).  Pt follows commands.

## 2019-04-29 ENCOUNTER — Encounter (HOSPITAL_COMMUNITY): Payer: Self-pay | Admitting: Pulmonary Disease

## 2019-04-29 ENCOUNTER — Inpatient Hospital Stay (HOSPITAL_COMMUNITY): Payer: Medicare Other

## 2019-04-29 DIAGNOSIS — D696 Thrombocytopenia, unspecified: Secondary | ICD-10-CM | POA: Diagnosis not present

## 2019-04-29 DIAGNOSIS — E46 Unspecified protein-calorie malnutrition: Secondary | ICD-10-CM

## 2019-04-29 DIAGNOSIS — Z515 Encounter for palliative care: Secondary | ICD-10-CM | POA: Diagnosis not present

## 2019-04-29 DIAGNOSIS — D6959 Other secondary thrombocytopenia: Secondary | ICD-10-CM | POA: Diagnosis present

## 2019-04-29 DIAGNOSIS — E162 Hypoglycemia, unspecified: Secondary | ICD-10-CM | POA: Diagnosis present

## 2019-04-29 DIAGNOSIS — L03115 Cellulitis of right lower limb: Secondary | ICD-10-CM | POA: Diagnosis present

## 2019-04-29 DIAGNOSIS — J189 Pneumonia, unspecified organism: Secondary | ICD-10-CM | POA: Diagnosis present

## 2019-04-29 DIAGNOSIS — E870 Hyperosmolality and hypernatremia: Secondary | ICD-10-CM | POA: Diagnosis present

## 2019-04-29 DIAGNOSIS — G7100 Muscular dystrophy, unspecified: Secondary | ICD-10-CM

## 2019-04-29 DIAGNOSIS — E43 Unspecified severe protein-calorie malnutrition: Secondary | ICD-10-CM | POA: Diagnosis present

## 2019-04-29 DIAGNOSIS — L03116 Cellulitis of left lower limb: Secondary | ICD-10-CM | POA: Diagnosis present

## 2019-04-29 DIAGNOSIS — S80812A Abrasion, left lower leg, initial encounter: Secondary | ICD-10-CM | POA: Diagnosis present

## 2019-04-29 DIAGNOSIS — N179 Acute kidney failure, unspecified: Secondary | ICD-10-CM | POA: Diagnosis present

## 2019-04-29 DIAGNOSIS — B952 Enterococcus as the cause of diseases classified elsewhere: Secondary | ICD-10-CM | POA: Diagnosis present

## 2019-04-29 DIAGNOSIS — Z993 Dependence on wheelchair: Secondary | ICD-10-CM | POA: Diagnosis not present

## 2019-04-29 DIAGNOSIS — G7102 Facioscapulohumeral muscular dystrophy: Secondary | ICD-10-CM | POA: Diagnosis present

## 2019-04-29 DIAGNOSIS — R9431 Abnormal electrocardiogram [ECG] [EKG]: Secondary | ICD-10-CM

## 2019-04-29 DIAGNOSIS — R6521 Severe sepsis with septic shock: Secondary | ICD-10-CM | POA: Diagnosis present

## 2019-04-29 DIAGNOSIS — Z79899 Other long term (current) drug therapy: Secondary | ICD-10-CM

## 2019-04-29 DIAGNOSIS — R627 Adult failure to thrive: Secondary | ICD-10-CM | POA: Diagnosis present

## 2019-04-29 DIAGNOSIS — A419 Sepsis, unspecified organism: Secondary | ICD-10-CM | POA: Diagnosis present

## 2019-04-29 DIAGNOSIS — Z66 Do not resuscitate: Secondary | ICD-10-CM | POA: Diagnosis present

## 2019-04-29 DIAGNOSIS — I4891 Unspecified atrial fibrillation: Secondary | ICD-10-CM | POA: Diagnosis present

## 2019-04-29 DIAGNOSIS — Z6825 Body mass index (BMI) 25.0-25.9, adult: Secondary | ICD-10-CM | POA: Diagnosis not present

## 2019-04-29 DIAGNOSIS — R7989 Other specified abnormal findings of blood chemistry: Secondary | ICD-10-CM

## 2019-04-29 DIAGNOSIS — N3000 Acute cystitis without hematuria: Secondary | ICD-10-CM | POA: Diagnosis present

## 2019-04-29 DIAGNOSIS — Z20828 Contact with and (suspected) exposure to other viral communicable diseases: Secondary | ICD-10-CM | POA: Diagnosis present

## 2019-04-29 DIAGNOSIS — J9612 Chronic respiratory failure with hypercapnia: Secondary | ICD-10-CM | POA: Diagnosis present

## 2019-04-29 DIAGNOSIS — E860 Dehydration: Secondary | ICD-10-CM | POA: Diagnosis present

## 2019-04-29 DIAGNOSIS — J9601 Acute respiratory failure with hypoxia: Secondary | ICD-10-CM | POA: Diagnosis not present

## 2019-04-29 LAB — CBC WITH DIFFERENTIAL/PLATELET
Abs Immature Granulocytes: 0.01 10*3/uL (ref 0.00–0.07)
Basophils Absolute: 0 10*3/uL (ref 0.0–0.1)
Basophils Relative: 0 %
Eosinophils Absolute: 0 10*3/uL (ref 0.0–0.5)
Eosinophils Relative: 0 %
HCT: 41.1 % (ref 39.0–52.0)
Hemoglobin: 12.6 g/dL — ABNORMAL LOW (ref 13.0–17.0)
Immature Granulocytes: 0 %
Lymphocytes Relative: 6 %
Lymphs Abs: 0.3 10*3/uL — ABNORMAL LOW (ref 0.7–4.0)
MCH: 30.7 pg (ref 26.0–34.0)
MCHC: 30.7 g/dL (ref 30.0–36.0)
MCV: 100 fL (ref 80.0–100.0)
Monocytes Absolute: 0.4 10*3/uL (ref 0.1–1.0)
Monocytes Relative: 9 %
Neutro Abs: 4 10*3/uL (ref 1.7–7.7)
Neutrophils Relative %: 85 %
Platelets: 44 10*3/uL — ABNORMAL LOW (ref 150–400)
RBC: 4.11 MIL/uL — ABNORMAL LOW (ref 4.22–5.81)
RDW: 18 % — ABNORMAL HIGH (ref 11.5–15.5)
WBC: 4.7 10*3/uL (ref 4.0–10.5)
nRBC: 1.1 % — ABNORMAL HIGH (ref 0.0–0.2)

## 2019-04-29 LAB — BASIC METABOLIC PANEL
Anion gap: 15 (ref 5–15)
BUN: 39 mg/dL — ABNORMAL HIGH (ref 6–20)
CO2: 27 mmol/L (ref 22–32)
Calcium: 9.2 mg/dL (ref 8.9–10.3)
Chloride: 105 mmol/L (ref 98–111)
Creatinine, Ser: 0.37 mg/dL — ABNORMAL LOW (ref 0.61–1.24)
GFR calc Af Amer: >60 mL/min (ref >60)
GFR calc non Af Amer: >60 mL/min (ref >60)
Glucose, Bld: 41 mg/dL — CL (ref 70–99)
Potassium: 4.3 mmol/L (ref 3.5–5.1)
Sodium: 147 mmol/L — ABNORMAL HIGH (ref 135–145)

## 2019-04-29 LAB — FIBRINOGEN: Fibrinogen: 347 mg/dL (ref 210–475)

## 2019-04-29 LAB — GLUCOSE, CAPILLARY
Glucose-Capillary: 127 mg/dL — ABNORMAL HIGH (ref 70–99)
Glucose-Capillary: 80 mg/dL (ref 70–99)
Glucose-Capillary: 70 mg/dL (ref 70–99)
Glucose-Capillary: 82 mg/dL (ref 70–99)
Glucose-Capillary: 104 mg/dL — ABNORMAL HIGH (ref 70–99)
Glucose-Capillary: 107 mg/dL — ABNORMAL HIGH (ref 70–99)

## 2019-04-29 LAB — HEMOGLOBIN A1C
Hgb A1c MFr Bld: 5.3 % (ref 4.8–5.6)
Mean Plasma Glucose: 105.41 mg/dL

## 2019-04-29 LAB — SARS CORONAVIRUS 2 (TAT 6-24 HRS): SARS Coronavirus 2: NEGATIVE

## 2019-04-29 LAB — HIV ANTIBODY (ROUTINE TESTING W REFLEX): HIV Screen 4th Generation wRfx: NONREACTIVE

## 2019-04-29 LAB — RETICULOCYTES
Immature Retic Fract: 11.7 % (ref 2.3–15.9)
RBC.: 4.11 MIL/uL — ABNORMAL LOW (ref 4.22–5.81)
Retic Count, Absolute: 51.4 10*3/uL (ref 19.0–186.0)
Retic Ct Pct: 1.3 % (ref 0.4–3.1)

## 2019-04-29 LAB — PATHOLOGIST SMEAR REVIEW

## 2019-04-29 LAB — LACTIC ACID, PLASMA
Lactic Acid, Venous: 2 mmol/L (ref 0.5–1.9)
Lactic Acid, Venous: 1.4 mmol/L (ref 0.5–1.9)

## 2019-04-29 LAB — D-DIMER, QUANTITATIVE: D-Dimer, Quant: 0.95 ug/mL-FEU — ABNORMAL HIGH (ref 0.00–0.50)

## 2019-04-29 LAB — MAGNESIUM: Magnesium: 1.7 mg/dL (ref 1.7–2.4)

## 2019-04-29 LAB — PHOSPHORUS: Phosphorus: 3.3 mg/dL (ref 2.5–4.6)

## 2019-04-29 LAB — ECHOCARDIOGRAM COMPLETE
Height: 67 in
Weight: 2567.92 oz

## 2019-04-29 MED ORDER — EPINEPHRINE 1 MG/10ML IJ SOSY
PREFILLED_SYRINGE | INTRAMUSCULAR | Status: AC
  Administered 2019-04-30: 1 mg
  Filled 2019-04-29: qty 20

## 2019-04-29 MED ORDER — INSULIN ASPART 100 UNIT/ML ~~LOC~~ SOLN: 0.0000 [IU] | SUBCUTANEOUS | Status: DC

## 2019-04-29 MED ORDER — SODIUM CHLORIDE 0.9 % IV SOLN: INTRAVENOUS | Status: DC

## 2019-04-29 MED ORDER — CHLORHEXIDINE GLUCONATE 0.12 % MT SOLN
15.0000 mL | Freq: Two times a day (BID) | OROMUCOSAL | Status: DC
  Administered 2019-04-29: 15 mL via OROMUCOSAL

## 2019-04-29 MED ORDER — THIAMINE HCL 100 MG/ML IJ SOLN
100.0000 mg | Freq: Two times a day (BID) | INTRAMUSCULAR | Status: DC
  Administered 2019-04-29 (×2): 100 mg via INTRAVENOUS
  Filled 2019-04-29 (×2): qty 2

## 2019-04-29 MED ORDER — DEXTROSE 50 % IV SOLN
INTRAVENOUS | Status: AC
  Filled 2019-04-29: qty 50

## 2019-04-29 MED ORDER — PANTOPRAZOLE SODIUM 40 MG IV SOLR
40.0000 mg | Freq: Every day | INTRAVENOUS | Status: DC
  Administered 2019-04-29 (×2): 40 mg via INTRAVENOUS
  Filled 2019-04-29 (×2): qty 40

## 2019-04-29 MED ORDER — HYDROCERIN EX CREA
TOPICAL_CREAM | Freq: Every day | CUTANEOUS | Status: DC
  Filled 2019-04-29: qty 113

## 2019-04-29 MED ORDER — AMIODARONE LOAD VIA INFUSION
150.0000 mg | Freq: Once | INTRAVENOUS | Status: AC
  Administered 2019-04-29: 150 mg via INTRAVENOUS
  Filled 2019-04-29: qty 83.34

## 2019-04-29 MED ORDER — AMIODARONE HCL IN DEXTROSE 360-4.14 MG/200ML-% IV SOLN
30.0000 mg/h | INTRAVENOUS | Status: DC
  Administered 2019-04-29 (×2): 30 mg/h via INTRAVENOUS
  Filled 2019-04-29: qty 200

## 2019-04-29 MED ORDER — CHLORHEXIDINE GLUCONATE CLOTH 2 % EX PADS
6.0000 | MEDICATED_PAD | Freq: Every day | CUTANEOUS | Status: DC
  Administered 2019-04-29: 6 via TOPICAL

## 2019-04-29 MED ORDER — DEXTROSE 50 % IV SOLN
25.0000 g | Freq: Once | INTRAVENOUS | Status: AC
  Administered 2019-04-29: 25 g via INTRAVENOUS

## 2019-04-29 MED ORDER — ONDANSETRON HCL 4 MG/2ML IJ SOLN: 4.0000 mg | Freq: Four times a day (QID) | INTRAMUSCULAR | Status: DC | PRN

## 2019-04-29 MED ORDER — ORAL CARE MOUTH RINSE
15.0000 mL | Freq: Two times a day (BID) | OROMUCOSAL | Status: DC
  Administered 2019-04-29 (×2): 15 mL via OROMUCOSAL

## 2019-04-29 MED ORDER — PHENYLEPHRINE HCL-NACL 10-0.9 MG/250ML-% IV SOLN
0.0000 ug/min | INTRAVENOUS | Status: DC
  Administered 2019-04-29: 90 ug/min via INTRAVENOUS
  Administered 2019-04-29: 20 ug/min via INTRAVENOUS
  Administered 2019-04-29: 60 ug/min via INTRAVENOUS
  Filled 2019-04-29 (×3): qty 250

## 2019-04-29 MED ORDER — PHENYLEPHRINE HCL-NACL 10-0.9 MG/250ML-% IV SOLN
25.0000 ug/min | INTRAVENOUS | Status: DC
  Administered 2019-04-29: 90 ug/min via INTRAVENOUS
  Administered 2019-04-29: 70 ug/min via INTRAVENOUS
  Administered 2019-04-29: 140 ug/min via INTRAVENOUS
  Administered 2019-04-29: 70 ug/min via INTRAVENOUS
  Administered 2019-04-29: 90 ug/min via INTRAVENOUS
  Administered 2019-04-30 (×2): 200 ug/min via INTRAVENOUS
  Filled 2019-04-29 (×3): qty 250
  Filled 2019-04-29: qty 500
  Filled 2019-04-29 (×4): qty 250

## 2019-04-29 MED ORDER — SODIUM CHLORIDE 0.9 % IV SOLN: 250.0000 mL | INTRAVENOUS | Status: DC

## 2019-04-29 MED ORDER — MAGNESIUM SULFATE 2 GM/50ML IV SOLN
2.0000 g | Freq: Once | INTRAVENOUS | Status: AC
  Administered 2019-04-29: 2 g via INTRAVENOUS
  Filled 2019-04-29: qty 50

## 2019-04-29 MED ORDER — LACTATED RINGERS IV BOLUS
1000.0000 mL | Freq: Once | INTRAVENOUS | Status: AC
  Administered 2019-04-29: 1000 mL via INTRAVENOUS

## 2019-04-29 MED ORDER — AMIODARONE HCL IN DEXTROSE 360-4.14 MG/200ML-% IV SOLN
60.0000 mg/h | INTRAVENOUS | Status: AC
  Administered 2019-04-29 (×2): 60 mg/h via INTRAVENOUS
  Filled 2019-04-29 (×3): qty 200

## 2019-04-29 MED ORDER — SODIUM CHLORIDE 0.9 % IV SOLN
1.0000 g | INTRAVENOUS | Status: DC
  Administered 2019-04-29: 1 g via INTRAVENOUS
  Filled 2019-04-29: qty 1

## 2019-04-29 MED ORDER — DEXTROSE-NACL 5-0.9 % IV SOLN: INTRAVENOUS | Status: DC

## 2019-04-29 MED ORDER — VANCOMYCIN HCL IN DEXTROSE 1-5 GM/200ML-% IV SOLN
1000.0000 mg | Freq: Two times a day (BID) | INTRAVENOUS | Status: DC
  Administered 2019-04-29 (×2): 1000 mg via INTRAVENOUS
  Filled 2019-04-29 (×2): qty 200

## 2019-04-29 MED ORDER — CIPROFLOXACIN HCL 0.3 % OP SOLN
2.0000 [drp] | Freq: Two times a day (BID) | OPHTHALMIC | Status: DC
  Administered 2019-04-29 (×2): 2 [drp] via OPHTHALMIC
  Filled 2019-04-29: qty 2.5

## 2019-04-29 MED ORDER — ACETAMINOPHEN 325 MG PO TABS: 650.0000 mg | ORAL_TABLET | ORAL | Status: DC | PRN

## 2019-04-29 NOTE — Progress Notes (Addendum)
[facility] Progress Note Patient Name: Mohsin Crum DOB: 05-Feb-1961 MRN: [id number]   Date of Service  04/29/2019  HPI/Events of Note  Hypotension - BP = 64/42 with HR = 126 (AFIB with RVR)  eICU Interventions  Will order: 1. Phneylepherine IV infusion via PIV.      Intervention Category Major Interventions: Hypotension - evaluation and management  Kaisen Ackers [place] 04/29/2019, 2:03 AM

## 2019-04-29 NOTE — Consult Note (Addendum)
[place]  [phone] [phone]    [place]  Referring MD:  Dr. Dorie Rank  Reason for Referral: Thrombocytopenia  HPI: Mr. 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  He was brought to the emergency room due to altered mental status and poor appetite.  Family reported worsening weakness, mental status, and inability to work his wheelchair anymore.  Family also reported minimal urine output, no longer speaking, and no longer eating.  He has chronic lower extremity edema and noted to have cellulitis and open wounds on his lower extremities.  He was hypotensive and hypothermic in the emergency room and received warmed IV fluids and was placed on a Bair hugger.  CBC was significant for platelet count 29,000.  Immature platelet fraction was elevated at 15.0. CMET showed an elevated BUN 42 creatinine 0.46, elevated AST at 91, elevated ALT 77, alk phos 166 and total bilirubin 1.4.  Lactic acid was not elevated.  UA showed moderate hemoglobin, large amount of leukocytes, and few bacteria.  Blood cultures and urine cultures were obtained and are pending.  Chest x-ray showed no active disease.  CT the abdomen pelvis showed a small right pleural effusion, airspace disease in the left lower lobe concerning for pneumonia, fatty infiltration of the liver, and right renal pelvic staghorn calculus.  He was admitted to the ICU for sepsis from multiple sources including extensive cellulitis in the lower extremities and questionable pneumonia.  Most recent previous CBC available to me was performed on 05/12/2009 at that time, the patient had mild thrombocytopenia with a platelet count of 117,000.  Otherwise, his CBC was unremarkable.  When seen today, the patient is awake.  He is unable to speak to me.  He nod his head yes and no.  He currently denies pain.  He has not seen any  bleeding.  Unable to obtain a comprehensive review of systems from the patient.  No family at the bedside. Hematology was asked see the patient to make recommendations regarding his thrombocytopenia.  Past Medical History:  Diagnosis Date  . 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 No pertinent surgical history.:  CURRENT MEDS: Current Facility-Administered Medications  Medication Dose Route Frequency Provider Last Rate Last Admin  . 0.9 %  sodium chloride infusion  250 mL Intravenous Continuous Noemi Chapel P, DO      . acetaminophen (TYLENOL) tablet 650 mg  650 mg Oral Q4H PRN Collier Bullock, MD      . amiodarone (NEXTERONE PREMIX) 360-4.14 MG/200ML-% (1.8 mg/mL) IV infusion  30 mg/hr Intravenous Continuous Anders Simmonds, MD 16.67 mL/hr at 04/29/19 0900 30 mg/hr at 04/29/19 0900  . cefTRIAXone (ROCEPHIN) 1 g in sodium chloride 0.9 % 100 mL IVPB  1 g Intravenous Q24H Collier Bullock, MD      . chlorhexidine (PERIDEX) 0.12 % solution 15 mL  15 mL Mouth Rinse BID Collier Bullock, MD   15 mL at 04/29/19 0928  . Chlorhexidine Gluconate Cloth 2 % PADS 6 each  6 each Topical Daily Collier Bullock, MD   6 each at 04/29/19 [phone]  . ciprofloxacin (CILOXAN) 0.3 % ophthalmic solution 2 drop  2 drop Both Eyes BID Noemi Chapel P, DO   2 drop at 04/29/19 1139  . dextrose 5 %-0.9 % sodium chloride infusion   Intravenous Continuous Minor, Grace Bushy, NP      . hydrocerin (EUCERIN) cream  Topical Daily Julian Hy, DO   Given at 04/29/19 1141  . insulin aspart (novoLOG) injection 0-6 Units  0-6 Units Subcutaneous Q4H Minor, Grace Bushy, NP      . MEDLINE mouth rinse  15 mL Mouth Rinse q12n4p Collier Bullock, MD      . ondansetron [facility]) injection 4 mg  4 mg Intravenous Q6H PRN Collier Bullock, MD      . pantoprazole (PROTONIX) injection 40 mg  40 mg Intravenous QHS Collier Bullock, MD   40 mg at 04/29/19 0313  . phenylephrine (NEOSYNEPHRINE) 10-0.9 MG/250ML-%  infusion  25-200 mcg/min Intravenous Titrated Julian Hy, DO 135 mL/hr at 04/29/19 1138 90 mcg/min at 04/29/19 1138  . thiamine (B-1) injection 100 mg  100 mg Intravenous BID Noemi Chapel P, DO      . vancomycin (VANCOCIN) IVPB 1000 mg/200 mL premix  1,000 mg Intravenous Q12H Collier Bullock, MD   Stopped at 04/29/19 0703      No Known Allergies:  Family History  Problem Relation Age of Onset  . Hypertension Father   . Irregular heart beat Father   :  Social History   Socioeconomic History  . Marital status: Single    Spouse name: Not on file  . Number of children: Not on file  . Years of education: Not on file  . Highest education level: Not on file  Occupational History  . Not on file  Tobacco Use  . Smoking status: Not on file  Substance and Sexual Activity  . Alcohol use: Not on file  . Drug use: Not on file  . Sexual activity: Not on file  Other Topics Concern  . Not on file  Social History Narrative  . Not on file   Social Determinants of Health   Financial Resource Strain:   . Difficulty of Paying Living Expenses: Not on file  Food Insecurity:   . Worried About Charity fundraiser in the Last Year: Not on file  . Ran Out of Food in the Last Year: Not on file  Transportation Needs:   . Lack of Transportation (Medical): Not on file  . Lack of Transportation (Non-Medical): Not on file  Physical Activity:   . Days of Exercise per Week: Not on file  . Minutes of Exercise per Session: Not on file  Stress:   . Feeling of Stress : Not on file  Social Connections:   . Frequency of Communication with Friends and Family: Not on file  . Frequency of Social Gatherings with Friends and Family: Not on file  . Attends Religious Services: Not on file  . Active Member of Clubs or Organizations: Not on file  . Attends Archivist Meetings: Not on file  . Marital Status: Not on file  Intimate Partner Violence:   . Fear of Current or Ex-Partner: Not on file   . Emotionally Abused: Not on file  . Physically Abused: Not on file  . Sexually Abused: Not on file  :  REVIEW OF SYSTEMS: Unable to obtain a comprehensive review of systems from the patient.  See HPI for symptoms reported by family.  Exam: Patient Vitals for the past 24 hrs:  BP Temp Temp src Pulse Resp SpO2 Height Weight  04/29/19 0930 (!) 93/56 (!) 97.2 F (36.2 C) -- (!) 123 (!) 27 96 % -- --  04/29/19 0915 (!) 86/63 97.9 F (36.6 C) -- (!) 41 (!) 24 96 % -- --  04/29/19 0900 (!) 84/60  98.2 F (36.8 C) Bladder (!) 112 (!) 25 95 % -- --  04/29/19 0845 (!) 97/58 (!) 86.4 F (30.2 C) -- (!) 113 17 96 % -- --  04/29/19 0830 (!) 88/51 98.1 F (36.7 C) -- (!) 108 (!) 26 96 % -- --  04/29/19 0815 92/61 98.1 F (36.7 C) -- 96 (!) 27 95 % -- --  04/29/19 0800 (!) 84/50 98.1 F (36.7 C) Bladder (!) 101 20 96 % -- --  04/29/19 0745 (!) 89/60 97.9 F (36.6 C) -- (!) 114 (!) 27 96 % -- --  04/29/19 0730 (!) 80/44 97.7 F (36.5 C) -- (!) 115 20 96 % -- --  04/29/19 0700 (!) 96/53 (!) 97 F (36.1 C) Bladder (!) 116 13 94 % -- --  04/29/19 0630 (!) 90/58 (!) 96.6 F (35.9 C) Oral (!) 102 (!) 36 95 % -- --  04/29/19 0430 (!) 95/56 (!) 96.1 F (35.6 C) Axillary 79 (!) 28 96 % -- --  04/29/19 0400 91/64 (!) 96.3 F (35.7 C) Bladder (!) 110 (!) 27 98 % -- --  04/29/19 0345 (!) 94/54 (!) 96.6 F (35.9 C) Axillary (!) 116 (!) 33 96 % -- --  04/29/19 0340 (!) 91/56 (!) 96.4 F (35.8 C) -- 84 18 96 % -- --  04/29/19 0330 (!) 93/56 (!) 97.5 F (36.4 C) Axillary 94 15 95 % -- --  04/29/19 0300 (!) 70/42 (!) 97.5 F (36.4 C) Axillary (!) 116 (!) 29 94 % -- --  04/29/19 0100 (!) 89/64 98.6 F (37 C) -- (!) 130 (!) 34 94 % -- 160 lb 7.9 oz (72.8 kg)  04/29/19 0030 (!) 90/58 97.9 F (36.6 C) -- (!) 137 (!) 33 94 % -- --  04/29/19 0015 (!) 89/62 (!) 97.5 F (36.4 C) -- (!) 129 (!) 32 93 % -- --  04/29/19 0000 (!) 87/65 (!) 97.2 F (36.2 C) -- (!) 133 (!) 38 94 % -- --  04/27/2019 2352 -- --  -- -- -- -- '5\' 7"'  (1.702 m) 150 lb (68 kg)  04/26/2019 2345 90/69 (!) 96.6 F (35.9 C) -- (!) 133 (!) 0 94 % -- --  05/11/2019 2315 (!) 104/57 (!) 95.7 F (35.4 C) -- (!) 127 19 95 % -- --  05/05/2019 2300 (!) 88/63 (!) 95.2 F (35.1 C) -- (!) 123 12 95 % -- --  04/16/2019 2230 90/60 (!) 93.9 F (34.4 C) -- (!) 113 (!) 25 94 % -- --  04/18/2019 2158 -- (!) 92.6 F (33.7 C) Rectal -- -- -- -- --  05/10/2019 2130 (!) 81/54 (!) 91.2 F (32.9 C) -- 97 16 95 % -- --  04/27/2019 2115 (!) 79/52 (!) 90.5 F (32.5 C) -- 96 (!) 21 96 % -- --  04/14/2019 2100 (!) 83/56 (!) 90 F (32.2 C) -- 99 (!) 29 96 % -- --  04/24/2019 2045 (!) 82/56 (!) 89.2 F (31.8 C) -- 85 (!) 27 96 % -- --  05/07/2019 2000 90/73 (!) 88.2 F (31.2 C) -- 73 (!) 28 97 % -- --  05/02/2019 1955 -- (!) 88.6 F (31.4 C) Rectal -- -- -- -- --  04/27/2019 1908 (!) 86/69 (!) 87.1 F (30.6 C) -- 75 19 97 % -- --  04/25/2019 1800 103/82 (!) 86.4 F (30.2 C) -- 68 (!) 28 100 % -- --  04/25/2019 1714 -- -- -- -- -- 100 % -- --    General: Chronically  ill-appearing male, not currently in any distress, alert and able to shake his head yes/no Eyes: Conjunctivae are reddened, no scleral icterus.   Lymphatics:  Negative cervical, supraclavicular or axillary adenopathy.   Respiratory: lungs were clear bilaterally without wheezing or crackles. Cardiovascular: Irregularly, irregular, currently on amiodarone drip  GI:  abdomen was soft, flat, nontender, nondistended, without organomegaly.   Skin: No petechiae, scattered bruising on his upper extremities.  Thick scaly skin on the bilateral lower extremities with open wounds.  Neuro: Alert, unable to answer questions verbally but can shake his head yes/no.  LABS:  Lab Results  Component Value Date   WBC 4.7 04/29/2019   HGB 12.6 (L) 04/29/2019   HCT 41.1 04/29/2019   PLT 44 (L) 04/29/2019   GLUCOSE 41 (LL) 04/29/2019   ALT 77 (H) 05/13/2019   AST 91 (H) 05/02/2019   NA 147 (H) 04/29/2019   K 4.3  04/29/2019   CL 105 04/29/2019   CREATININE 0.37 (L) 04/29/2019   BUN 39 (H) 04/29/2019   CO2 27 04/29/2019   INR 1.0 05/06/2019    CT ABDOMEN PELVIS W CONTRAST  Result Date: 05/01/2019 CLINICAL DATA:  Elevated LFTs, sepsis EXAM: CT ABDOMEN AND PELVIS WITH CONTRAST TECHNIQUE: Multidetector CT imaging of the abdomen and pelvis was performed using the standard protocol following bolus administration of intravenous contrast. CONTRAST:  112m OMNIPAQUE IOHEXOL 300 MG/ML  SOLN COMPARISON:  None. FINDINGS: Lower chest: Airspace disease in the left lower lobe concerning for pneumonia. Small right pleural effusion. Hepatobiliary: Diffuse low-density throughout the liver compatible with fatty infiltration. No focal abnormality. Gallbladder unremarkable. Pancreas: No focal abnormality or ductal dilatation. Spleen: No focal abnormality.  Normal size. Adrenals/Urinary Tract: The right upper pole collecting system is mildly prominent likely related to the staghorn calculus. Ureters are decompressed. Foley catheter in place with urinary bladder decompressed. Stomach/Bowel: Stomach, large and small bowel grossly unremarkable. Vascular/Lymphatic: Aortic atherosclerosis. No enlarged abdominal or pelvic lymph nodes. Reproductive: No visible focal abnormality. Other: No free fluid or free air. Musculoskeletal: No acute bony abnormality. Advanced degenerative facet disease in the lumbar spine. IMPRESSION: Small right pleural effusion. Airspace disease in the left lower lobe concerning for pneumonia. Fatty infiltration of the liver. Right renal pelvic staghorn calculus. Caliectasis in the upper pole of the right kidney. Ureters are decompressed. Aortic atherosclerosis. Electronically Signed   By: KRolm BaptiseM.D.   On: 04/27/2019 20:09   DG Chest Port 1 View  Result Date: 04/25/2019 CLINICAL DATA:  Weakness EXAM: PORTABLE CHEST 1 VIEW COMPARISON:  None. FINDINGS: Heart size and vascularity normal. Lungs are clear.  Patient rotated. Subluxation right shoulder may be chronic.  Shallow glenoid fossa. IMPRESSION: No active disease. Electronically Signed   By: CFranchot GalloM.D.   On: 05/11/2019 18:37    ASSESSMENT AND PLAN:  1.  Thrombocytopenia, likely secondary to sepsis, improving 2.  Sepsis due to multiple sources including extensive cellulitis in his lower extremities and ? Pneumonia 3.  Muscular dystrophy 4.  Protein calorie malnutrition 5.  Elevated LFTs  -The patient had mild thrombocytopenia noted on labs from 10 years ago.  Now more thrombocytopenic, but platelet count improving this morning.  CT of the abdomen pelvis did not show any evidence of splenomegaly but did show some fatty infiltration of his liver.  Suspect thrombocytopenia is due to underlying sepsis.  We have requested pathology review of peripheral blood smear which is pending.  Labs do not show evidence of hemolysis.  Recommend close monitoring  of his CBC.  Transfuse platelets if platelet count drops below 20,000 in the setting of sepsis or active bleeding.  No transfusions indicated at this time. -Continue management of his sepsis, cellulitis, and ? pneumonia per PCCM.  Thank you for this referral.  Mikey Bussing, DNP, AGPCNP-BC, AOCNP  Addendum  I have seen the patient, examined him. I agree with the assessment and and plan and have edited the notes.   I have reviewed his lab and CT scan from yesterday. His thrombocytopenia is likely from infection and sepsis, it has improved overnight since broad antibiotics started.  She has no bleeding, no need platelet transfusion.  She has no history of liver disease, no evidence of liver cirrhosis or splenomegaly on CT scan. Although ITP is also a possibility, given his mild thrombocytopenia 10 years ago.  Given his level of thrombocytopenia, and improvement with infection control, this is felt to be less likely.  We will f/u his blood counts, and see him again if needed.   Truitt Merle   04/29/2019

## 2019-04-29 NOTE — Progress Notes (Signed)
Called e link to get orders to discontinue air/con precautions due to patient having two negative covid tests. Dr was busy, passed message to nurse Kathlee Nations. Will try to re-call physician if I have not been contacted by 0530

## 2019-04-29 NOTE — Progress Notes (Signed)
  Echocardiogram 2D Echocardiogram has been performed.  Dustin Wong Dustin Wong 04/29/2019, 1:12 PM

## 2019-04-29 NOTE — Progress Notes (Addendum)
[facility] Progress Note Patient Name: Dustin Wong DOB: 08-10-1960 MRN: [id number]   Date of Service  04/29/2019  HPI/Events of Note  Patient in AFIB with RVR on arrival to ICU - K+ = 4.6 BP = 103/65 with MAP = 78.  eICU Interventions  Will order: 1. 12 Lead EKG now.  2. Mg++ level STAT. 3. Amiodarone IV load and infusion.      Intervention Category Major Interventions: Arrhythmia - evaluation and management  Sharlot Sturkey Eugene 04/29/2019, 1:35 AM

## 2019-04-29 NOTE — Progress Notes (Signed)
Patient arrived via gurney from ED. CNA and ED nurse were present, PT transferred to bed by myself and ED staff.

## 2019-04-29 NOTE — TOC Initial Note (Signed)
Transition of Care [facility]) - Initial/Assessment Note    Patient Details  Name: Dustin Wong MRN: [id number] Date of Birth: January 22, 1961  Transition of [facility]) CM/SW Contact:    Lynnell Catalan, RN Phone Number: 04/29/2019, 1:10 PM  Clinical Narrative:                 TOC received consult for SNF. This was clarified with attending, and SNF consult not needed. Pt lives with family and has been wheelchair bound for 5 years. Family is used to caring for him. TOC will continue to follow for any change in DC needs.  Expected Discharge Plan: Home/Self Care Barriers to Discharge: Continued Medical Work up   Expected Discharge Plan and Services Expected Discharge Plan: Home/Self Care     Admission diagnosis:  Acute cystitis without hematuria [N30.00] Hypothermia, initial encounter [T68.XXXA] Sepsis ([place]) [A41.9] Sepsis, due to unspecified organism, unspecified whether acute organ dysfunction present [facility]) [A41.9] Patient Active Problem List   Diagnosis Date Noted  . Sepsis ([place]) 04/29/2019   PCP:  Carol Ada, MD Pharmacy:   [id number] - [place], [place] [address] [place] [address] Phone: [phone] Fax: [phone]  Readmission Risk Interventions No flowsheet data found.

## 2019-04-29 NOTE — H&P (Signed)
Please see consultation note by Marchelle Gearing on 03/29/2019.  Julian Hy, DO 04/29/19 11:08 AM [facility]

## 2019-04-29 NOTE — Consult Note (Signed)
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  Weakness x 1 day, not able to work his wheelchair anymore, minimal urine output, no longer speaking, not eating.   History of present illness    Advanced muscular dystrophy, FTT, decreasing appetite, hypotensive.   Brought by family for decreasing appetite.   Actually lives alone, normally able to do simple things for himself, has mother and sister who are with him for a lot of the day.  Chronic LE edema, increasing erythema and weeping from BLE.   He does not like leaving his house, has resisted going to the doctor for a long time, per his sister.   Cr up to 0.46 from prior <0.3 (2010)  In ED has received 1.2 L bolus at the time of my exam.    Past Medical History  Muscular dystrophy BLE edema chronically  Significant Hospital Events     Consults:    Procedures:    Significant Diagnostic Tests:  CT chest and Abd: LLL consolidation  Micro Data:    Antimicrobials:  Ceftriaxone12/16>> Vancomycin12/16>>  Interim history/subjective:  Poorly responsive  Objective   Blood pressure (!) 93/56, pulse (!) 123, temperature (!) 97.2 F (36.2 C), resp. rate (!) 27, height 5\' 7"  (1.702 m), weight 72.8 kg, SpO2 96 %.        Intake/Output Summary (Last 24 hours) at 04/29/2019 1010 Last data filed at 04/29/2019 0900 Gross per 24 hour  Intake 3666.35 ml  Output 0 ml  Net 3666.35 ml   Filed Weights   05/08/2019 2352 04/29/19 0100  Weight: 68 kg 72.8 kg    Examination: General: 58 year old male who appears older than stated age HEENT: Conjunctive noted to be reddened, no JVD or lymphadenopathy is appreciated Neuro: Has history of MD poorly  responsive CV: Heart sounds are regular irregular currently on amiodarone drip PULM: Room air with abdominal chest wall paradoxus  GI: Soft nontender positive bowel GU: Foley with amber urine Extremities: Bilateral lower extremities with extensive cellulitis and open wounds.          Resolved Hospital Problem list     Assessment & Plan: Hypotension the setting of most likely sepsis from multiple sources.  He has extensive cellulitis in the lower extremities.  Questionable pneumonia.  Hypothermia Empirical antimicrobial therapy [place] Vasopressor support as needed Fluid resuscitation Warmer as needed  Muscular dystrophy which is advanced appears end-stage in nature Need ongoing discussions with sister concerning end-of-life discussions No longer eating or speaking  Severe malnutrition Will need feeding tube feeds continue to be fed We will have to observe for refeeding syndrome  Elevated LFTs Continue to monitor     Thrombocytopenia - Heme consulting. PT and INR nl.  fibrinogen, retics, d dimer pending.  MD - progression of disease, FTT.  Suspect depression could be component of FTT.   Best practice:  Diet: NPO, can eat if passes swallow eval.   Pain/Anxiety/Delirium protocol (if indicated):   VAP protocol (if indicated):  DVT prophylaxis: none due to thrombocytopenia GI prophylaxis: ppi Glucose control: prn Mobility: bed.  Normally sits and sleeps in his wheelchair.  Code Status: Discussed with his sister.  She is not sure he would want cpr and intubation.  He is unable to clearly show that he understands and can make that decision.  Talked about risks vs benefits of mechanical vent and cpr.  I advised that DNR/DNI would be appropriate.  [name] will discuss with her mother and let us know tomorrow.  We will need further discussion with sisters concerning CODE STATUS Family Communication: Sister named [name] is healthcare power of attorney Disposition: ICU  Labs    CBC: Recent Labs  Lab 05/05/2019 1736 04/29/19 0157  WBC 4.4 4.7  NEUTROABS 3.7 4.0  HGB 15.5 12.6*  HCT 50.5 41.1  MCV 101.2* 100.0  PLT 29* 44*    Basic Metabolic Panel: Recent Labs  Lab 04/27/2019 1736 04/29/19 0157  NA 149* 147*  K 4.6 4.3  CL 104 105  CO2 33* 27  GLUCOSE 75 41*  BUN 42* 39*  CREATININE 0.46* 0.37*  CALCIUM 10.1 9.2  MG  --  1.7  PHOS  --  3.3   GFR: Estimated Creatinine Clearance: 93 mL/min (A) (by C-G formula based on SCr of 0.37 mg/dL (L)). Recent Labs  Lab 04/16/2019 1736 04/29/19 0157  WBC 4.4 4.7  LATICACIDVEN 1.4 2.0*    Liver Function Tests: Recent Labs  Lab 04/17/2019 1736  AST 91*  ALT 77*  ALKPHOS 166*  BILITOT 1.4*  PROT 7.7  ALBUMIN 3.4*   No results for input(s): LIPASE, AMYLASE in the last 168 hours. No results for input(s): AMMONIA in the last 168 hours.  ABG No results found for: PHART, PCO2ART, PO2ART, HCO3, TCO2, ACIDBASEDEF, O2SAT   Coagulation Profile: Recent Labs  Lab 04/13/2019 1736  INR 1.0    Cardiac Enzymes: Recent Labs  Lab 04/29/2019 1736  CKTOTAL 129    HbA1C: No results found for: HGBA1C  CBG: Recent Labs  Lab 04/29/19 0333 04/29/19 0837  GLUCAP 127* 80      Critical care time: 50 min   Richardson Landry Shanti Eichel ACNP Acute Care Nurse Practitioner [place] Please consult Amion 04/29/2019, 10:17 AM

## 2019-04-29 NOTE — Progress Notes (Signed)
Pharmacy Antibiotic Note  Dustin Wong is a 58 y.o. male admitted on 05-03-19 with sepsis.  Pharmacy has been consulted for vancomycin dosing.  Plan: Vancomycin 1gm iv x1, then Vancomycin 1000 mg IV Q 12 hrs. Goal AUC 400-550. Expected AUC: 467 SCr used: 0.8 (adjusted)  Height: 5\' 7"  (170.2 cm) Weight: 160 lb 7.9 oz (72.8 kg) IBW/kg (Calculated) : 66.1  Temp (24hrs), Avg:92.7 F (33.7 C), Min:86.4 F (30.2 C), Max:98.6 F (37 C)  Recent Labs  Lab May 03, 2019 1736 04/29/19 0157  WBC 4.4 4.7  CREATININE 0.46* 0.37*  LATICACIDVEN 1.4 2.0*    Estimated Creatinine Clearance: 93 mL/min (A) (by C-G formula based on SCr of 0.37 mg/dL (L)).    No Known Allergies  Antimicrobials this admission: Vancomycin May 03, 2019 >> Ceftriaxone  2019-05-03 >>   Dose adjustments this admission: -  Microbiology results: -  Thank you for allowing pharmacy to be a part of this patient's care.  Dustin Wong 04/29/2019 3:40 AM

## 2019-04-29 NOTE — Progress Notes (Signed)
Increased pt's O2 up to 4 liters, pt is coughing up thick secretions. O2 sats are 86%. MD aware and at bedside.

## 2019-04-29 NOTE — Consult Note (Addendum)
WOC Nurse Consult Note: Wound type: Consult requested for bilat legs.  Performed remotely after review of photos and progress notes in the EMR.  Pt has generalized edema and extremely dry peeling scaley skin with patchy areas of dry crusted dark brown scabbed skin. Called bedside nurse via phone to discuss appearance and she stated there are no wounds to bilat heels and foam dressings have been applied as preventive measures. Dressing procedure/placement/frequency: Topical treatment orders provided for bedside nurse to perform daily as follows: Wash legs daily, then roughly towel-dry to assist with removal of loose nonviable skin. Then, apply Eucerin cream to bilat legs Q day.  Float heels to reduce pressure.  Please re-consult if further assistance is needed.  Thank-you,  Julien Girt MSN, [place], [place], [place], [place]

## 2019-04-29 NOTE — Progress Notes (Signed)
Called to ED nurse to clarify if patient is symptomatic or asymptomatic when it comes to the Covid evaulation d/t admitting order by MD states confirmed covid negative, however, POC Covid test negative but the 6-24 hour PCR test is still pending.  ED nurse advised that the MD advised asymptomatic and does not believe this admission is Covid related.  However, airborne/contact isolation remains as an active order.  Asked ED nurse to verify isolation orders prior to bringing patient to the unit.  Jacqulyn Ducking ICU/SD RN4 / Care Coordinator / Rapid Response Nurse Rapid Response Number:  [phone] ICU Charge Nurse Number:  [phone]

## 2019-04-29 NOTE — Progress Notes (Signed)
Pt alert and able to nob head slightly to questions.  Order received to place small bore feeding tube, Pt refuses feeding tube and shakes his head no to it being placed. Emergency planning/management officer at beside. This RN explained to patient that it was just temporary to increase his hydration and nutrition. Pt still nodding head no to placement. Will notify Dr Carlis Abbott.

## 2019-04-29 NOTE — Progress Notes (Signed)
Sister at bedside, pt speaking to his sister and answering questions appropriately. Pt complaining of feeling hot while rocephin was running. No redness or rash, vitals normal. Pt placed on 2 liters nasal cannula per sisters request. Pt's sats were above 90 on room air. MD notified about pt feeling hot. Axillary temp was 95.6. Sister states that 97.0 oral is baseline for patient.

## 2019-04-29 NOTE — Consult Note (Signed)
Reason for Consult: Bilateral lower extremity cellulitis in a patient who septic Referring Physician: Pulmonary critical care physicians  Izeyah Deike is an 58 y.o. male.  HPI: The patient is a 58 year old wheelchair-bound patient who is unable to give significant history.  By discussion with his physicians it appears that he is wheelchair-bound over the last 5 years with statements by the family that he has a several month history of some edema in the lower extremities.  He is recently had a little skin breakdown and he comes in septic and there was concern that the sepsis could be coming from his lower extremity wounds.  We are consulted for evaluation.  They are specifically concerned about a wound on his left lower extremity  Past Medical History:  Diagnosis Date  . 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 No pertinent surgical history.  Family History  Problem Relation Age of Onset  . Hypertension Father   . Irregular heart beat Father     Social History:  has no history on file for tobacco, alcohol, and drug.  Allergies: No Known Allergies  Medications: I have reviewed the patient's current medications.  Results for orders placed or performed during the hospital encounter of 04/20/2019 (from the past 48 hour(s))  Lactic acid, plasma     Status: None   Collection Time: 05/03/2019  5:36 PM  Result Value Ref Range   Lactic Acid, Venous 1.4 0.5 - 1.9 mmol/L    Comment: Performed at [facility], [place] [address], [place], [place] [address]  Comprehensive metabolic panel     Status: Abnormal   Collection Time: 05/12/2019  5:36 PM  Result Value Ref Range   Sodium 149 (H) 135 - 145 mmol/L   Potassium 4.6 3.5 - 5.1 mmol/L   Chloride 104 98 - 111 mmol/L   CO2 33 (H) 22 - 32 mmol/L   Glucose, Bld 75 70 - 99 mg/dL   BUN 42 (H) 6 - 20 mg/dL   Creatinine, Ser 0.46 (L) 0.61 - 1.24 mg/dL   Calcium 10.1 8.9 - 10.3 mg/dL   Total Protein 7.7 6.5 -  8.1 g/dL   Albumin 3.4 (L) 3.5 - 5.0 g/dL   AST 91 (H) 15 - 41 U/L   ALT 77 (H) 0 - 44 U/L   Alkaline Phosphatase 166 (H) 38 - 126 U/L   Total Bilirubin 1.4 (H) 0.3 - 1.2 mg/dL   GFR calc non Af Amer >60 >60 mL/min   GFR calc Af Amer >60 >60 mL/min   Anion gap 12 5 - 15    Comment: Performed at [facility], [place] [address], [place], [place] [address]  CBC WITH DIFFERENTIAL     Status: Abnormal   Collection Time: 05/11/2019  5:36 PM  Result Value Ref Range   WBC 4.4 4.0 - 10.5 K/uL   RBC 4.99 4.22 - 5.81 MIL/uL   Hemoglobin 15.5 13.0 - 17.0 g/dL   HCT 50.5 39.0 - 52.0 %   MCV 101.2 (H) 80.0 - 100.0 fL   MCH 31.1 26.0 - 34.0 pg   MCHC 30.7 30.0 - 36.0 g/dL   RDW 18.6 (H) 11.5 - 15.5 %   Platelets 29 (LL) 150 - 400 K/uL    Comment: REPEATED TO VERIFY PLATELET COUNT CONFIRMED BY SMEAR SPECIMEN CHECKED FOR CLOTS Immature Platelet Fraction may be clinically indicated, consider ordering this additional test [id number] CRITICAL RESULT CALLED TO, READ BACK BY AND VERIFIED WITH: P.DOWD  AT 1840 ON 05/13/2019 BY N.THOMPSON    nRBC 0.5 (H) 0.0 - 0.2 %   Neutrophils Relative % 83 %   Neutro Abs 3.7 1.7 - 7.7 K/uL   Lymphocytes Relative 10 %   Lymphs Abs 0.4 (L) 0.7 - 4.0 K/uL   Monocytes Relative 7 %   Monocytes Absolute 0.3 0.1 - 1.0 K/uL   Eosinophils Relative 0 %   Eosinophils Absolute 0.0 0.0 - 0.5 K/uL   Basophils Relative 0 %   Basophils Absolute 0.0 0.0 - 0.1 K/uL   Immature Granulocytes 0 %   Abs Immature Granulocytes 0.01 0.00 - 0.07 K/uL    Comment: Performed at [facility], [place] [address] [place], [place] [address]  APTT     Status: Abnormal   Collection Time: 04/26/2019  5:36 PM  Result Value Ref Range   aPTT 41 (H) 24 - 36 seconds    Comment:        IF BASELINE aPTT IS ELEVATED, SUGGEST PATIENT RISK ASSESSMENT BE USED TO DETERMINE APPROPRIATE ANTICOAGULANT THERAPY. Performed at [facility], [place] [address]  [place], [place] [address]   Protime-INR     Status: None   Collection Time: 04/21/2019  5:36 PM  Result Value Ref Range   Prothrombin Time 12.7 11.4 - 15.2 seconds   INR 1.0 0.8 - 1.2    Comment: (NOTE) INR goal varies based on device and disease states. Performed at [facility], [place] [address], [place], [place] [address]   Blood Culture (routine x 2)     Status: None (Preliminary result)   Collection Time: 05/09/2019  5:36 PM   Specimen: BLOOD RIGHT ARM  Result Value Ref Range   Specimen Description      BLOOD RIGHT ARM Performed at [facility], [place] [address] [place], [place] [address]    Special Requests      BOTTLES DRAWN AEROBIC AND ANAEROBIC Blood Culture results may not be optimal due to an inadequate volume of blood received in culture bottles Performed at [facility], [address], [place], [place] [address]    Culture      NO GROWTH < 24 HOURS Performed at [place] [address] [place], [place] [address]    Report Status PENDING   CK     Status: None   Collection Time: 04/27/2019  5:36 PM  Result Value Ref Range   Total CK 129 49 - 397 U/L    Comment: Performed at [facility], [place] [address], [place], [place] [address]  Immature Platelet Fraction     Status: Abnormal   Collection Time: 05/05/2019  5:36 PM  Result Value Ref Range   Immature Platelet Fraction 15.0 (H) 1.2 - 8.6 %    Comment:        An elevated IPF indicates increased platelet production. A low platelet count with an elevated IPF may be associated with peripheral platelet destruction (e.g. DIC, ITP) or bone marrow recovery (e.g. after chemotherapy or transplant). A low platelet count with a low or non- elevated IPF is consistent with a platelet production disorder. Performed at [facility], [place] [address], [place], [place] [address]   TSH     Status: None   Collection Time:  05/08/2019  5:44 PM  Result Value Ref Range   TSH 3.002 0.350 - 4.500 uIU/mL    Comment: Performed by a 3rd Generation  assay with a functional sensitivity of <=0.01 uIU/mL. Performed at [facility] [place], [place] [address], [place], [place] [address]   Urinalysis, Routine w reflex microscopic     Status: Abnormal   Collection Time: 04/16/2019  6:18 PM  Result Value Ref Range   Color, Urine AMBER (A) YELLOW    Comment: BIOCHEMICALS MAY BE AFFECTED BY COLOR   APPearance HAZY (A) CLEAR   Specific Gravity, Urine 1.016 1.005 - 1.030   pH 5.0 5.0 - 8.0   Glucose, UA NEGATIVE NEGATIVE mg/dL   Hgb urine dipstick MODERATE (A) NEGATIVE   Bilirubin Urine NEGATIVE NEGATIVE   Ketones, ur NEGATIVE NEGATIVE mg/dL   Protein, ur 30 (A) NEGATIVE mg/dL   Nitrite NEGATIVE NEGATIVE   Leukocytes,Ua LARGE (A) NEGATIVE   RBC / HPF 11-20 0 - 5 RBC/hpf   WBC, UA 21-50 0 - 5 WBC/hpf   Bacteria, UA FEW (A) NONE SEEN   Mucus PRESENT     Comment: Performed at [facility], [place] [address] [place], [place] [address]  POC SARS Coronavirus 2 Ag-ED - Nasal Swab (BD Veritor Kit)     Status: None   Collection Time: 05/06/2019  7:07 PM  Result Value Ref Range   SARS Coronavirus 2 Ag NEGATIVE NEGATIVE    Comment: (NOTE) SARS-CoV-2 antigen NOT DETECTED.  Negative results are presumptive.  Negative results do not preclude SARS-CoV-2 infection and should not be used as the sole basis for treatment or other patient management decisions, including infection  control decisions, particularly in the presence of clinical signs and  symptoms consistent with COVID-19, or in those who have been in contact with the virus.  Negative results must be combined with clinical observations, patient history, and epidemiological information. The expected result is Negative. Fact Sheet for Patients: PodPark.tn Fact Sheet for Healthcare  Providers: GiftContent.is This test is not yet approved or cleared by the [place] FDA and  has been authorized for detection and/or diagnosis of SARS-CoV-2 by FDA under an Emergency Use Authorization (EUA).  This EUA will remain in effect (meaning this test can be used) for the duration of  the COVID-19 de claration under Section 564(b)(1) of the Act, 21 U.S.C. section 360bbb-3(b)(1), unless the authorization is terminated or revoked sooner.   SARS CORONAVIRUS 2 (TAT 6-24 HRS) Nasopharyngeal Nasopharyngeal Swab     Status: None   Collection Time: 04/14/2019  8:40 PM   Specimen: Nasopharyngeal Swab  Result Value Ref Range   SARS Coronavirus 2 NEGATIVE NEGATIVE    Comment: (NOTE) SARS-CoV-2 target nucleic acids are NOT DETECTED. The SARS-CoV-2 RNA is generally detectable in upper and lower respiratory specimens during the acute phase of infection. Negative results do not preclude SARS-CoV-2 infection, do not rule out co-infections with other pathogens, and should not be used as the sole basis for treatment or other patient management decisions. Negative results must be combined with clinical observations, patient history, and epidemiological information. The expected result is Negative. Fact Sheet for Patients: SugarRoll.be Fact Sheet for Healthcare Providers: https://www.woods-mathews.com/ This test is not yet approved or cleared by the [place] FDA and  has been authorized for detection and/or diagnosis of SARS-CoV-2 by FDA under an Emergency Use Authorization (EUA). This EUA will remain  in effect (meaning this test can be used) for the duration of the COVID-19 declaration under Section 56 4(b)(1) of the Act, 21 U.S.C. section 360bbb-3(b)(1), unless the authorization is terminated or revoked sooner. Performed at [facility],  [address], [place], [place] [address]   Lactic acid, plasma      Status: Abnormal   Collection Time: 04/29/19  1:57 AM  Result Value Ref Range   Lactic Acid, Venous 2.0 (HH) 0.5 - 1.9 mmol/L    Comment: CRITICAL RESULT CALLED TO, READ BACK BY AND VERIFIED WITH: rn stacy at 0247 04/29/19 [place] a Performed at [facility], [place] [address], [place], [place] [address]   D-dimer, quantitative (not at [facility])     Status: Abnormal   Collection Time: 04/29/19  1:57 AM  Result Value Ref Range   D-Dimer, Quant 0.95 (H) 0.00 - 0.50 ug/mL-FEU    Comment: (NOTE) At the manufacturer cut-off of 0.50 ug/mL FEU, this assay has been documented to exclude PE with a sensitivity and negative predictive value of 97 to 99%.  At this time, this assay has not been approved by the FDA to exclude DVT/VTE. Results should be correlated with clinical presentation. Performed at [facility], [place] [address] [place], [place] [address]   Fibrinogen     Status: None   Collection Time: 04/29/19  1:57 AM  Result Value Ref Range   Fibrinogen 347 210 - 475 mg/dL    Comment: Performed at [facility], [place] [address] [place], [place] [address]  Reticulocytes     Status: Abnormal   Collection Time: 04/29/19  1:57 AM  Result Value Ref Range   Retic Ct Pct 1.3 0.4 - 3.1 %   RBC. 4.11 (L) 4.22 - 5.81 MIL/uL   Retic Count, Absolute 51.4 19.0 - 186.0 K/uL   Immature Retic Fract 11.7 2.3 - 15.9 %    Comment: Performed at [facility], [place] [address] [place], [place] [address]  CBC with Differential tomorrow     Status: Abnormal   Collection Time: 04/29/19  1:57 AM  Result Value Ref Range   WBC 4.7 4.0 - 10.5 K/uL   RBC 4.11 (L) 4.22 - 5.81 MIL/uL   Hemoglobin 12.6 (L) 13.0 - 17.0 g/dL   HCT 41.1 39.0 - 52.0 %   MCV 100.0 80.0 - 100.0 fL   MCH 30.7 26.0 - 34.0 pg   MCHC 30.7 30.0 - 36.0 g/dL   RDW 18.0 (H) 11.5 - 15.5 %   Platelets 44 (L) 150 - 400 K/uL    Comment: Immature Platelet Fraction may  be clinically indicated, consider ordering this additional test [id number]    nRBC 1.1 (H) 0.0 - 0.2 %   Neutrophils Relative % 85 %   Neutro Abs 4.0 1.7 - 7.7 K/uL   Lymphocytes Relative 6 %   Lymphs Abs 0.3 (L) 0.7 - 4.0 K/uL   Monocytes Relative 9 %   Monocytes Absolute 0.4 0.1 - 1.0 K/uL   Eosinophils Relative 0 %   Eosinophils Absolute 0.0 0.0 - 0.5 K/uL   Basophils Relative 0 %   Basophils Absolute 0.0 0.0 - 0.1 K/uL   Immature Granulocytes 0 %   Abs Immature Granulocytes 0.01 0.00 - 0.07 K/uL    Comment: Performed at [facility], [place] [address], [place], [place] [address]  HIV Antibody (routine testing w rflx)     Status: None   Collection Time: 04/29/19  1:57 AM  Result Value Ref Range   HIV Screen 4th Generation wRfx NON REACTIVE NON REACTIVE    Comment: Performed at [place] [address] [place],  [address]  Basic metabolic  panel     Status: Abnormal   Collection Time: 04/29/19  1:57 AM  Result Value Ref Range   Sodium 147 (H) 135 - 145 mmol/L   Potassium 4.3 3.5 - 5.1 mmol/L   Chloride 105 98 - 111 mmol/L   CO2 27 22 - 32 mmol/L   Glucose, Bld 41 (LL) 70 - 99 mg/dL    Comment: CRITICAL RESULT CALLED TO, READ BACK BY AND VERIFIED WITH: rn stacy at 0248 04/29/19 cru[place]hank a    BUN 39 (H) 6 - 20 mg/dL   Creatinine, Ser 0.37 (L) 0.61 - 1.24 mg/dL   Calcium 9.2 8.9 - 10.3 mg/dL   GFR calc non Af Amer >60 >60 mL/min   GFR calc Af Amer >60 >60 mL/min   Anion gap 15 5 - 15    Comment: Performed at [facility], [place] [address], [place], [place] [address]  Phosphorus     Status: None   Collection Time: 04/29/19  1:57 AM  Result Value Ref Range   Phosphorus 3.3 2.5 - 4.6 mg/dL    Comment: Performed at [facility], [place] [address], [place], [place] [address]  Magnesium     Status: None   Collection Time: 04/29/19  1:57 AM  Result Value Ref Range   Magnesium 1.7 1.7 - 2.4 mg/dL    Comment:  Performed at [facility], [place] [address] [place], [place] [address]  Glucose, capillary     Status: Abnormal   Collection Time: 04/29/19  3:33 AM  Result Value Ref Range   Glucose-Capillary 127 (H) 70 - 99 mg/dL  Glucose, capillary     Status: None   Collection Time: 04/29/19  8:37 AM  Result Value Ref Range   Glucose-Capillary 80 70 - 99 mg/dL   Comment 1 Notify RN    Comment 2 Document in Chart   Glucose, capillary     Status: None   Collection Time: 04/29/19 11:34 AM  Result Value Ref Range   Glucose-Capillary 70 70 - 99 mg/dL   Comment 1 Notify RN    Comment 2 Document in Chart   Lactic acid, plasma     Status: None   Collection Time: 04/29/19 11:54 AM  Result Value Ref Range   Lactic Acid, Venous 1.4 0.5 - 1.9 mmol/L    Comment: Performed at [facility], [place] [address], [place], [place] [address]    CT ABDOMEN PELVIS W CONTRAST  Result Date: 05/07/2019 CLINICAL DATA:  Elevated LFTs, sepsis EXAM: CT ABDOMEN AND PELVIS WITH CONTRAST TECHNIQUE: Multidetector CT imaging of the abdomen and pelvis was performed using the standard protocol following bolus administration of intravenous contrast. CONTRAST:  162m OMNIPAQUE IOHEXOL 300 MG/ML  SOLN COMPARISON:  None. FINDINGS: Lower chest: Airspace disease in the left lower lobe concerning for pneumonia. Small right pleural effusion. Hepatobiliary: Diffuse low-density throughout the liver compatible with fatty infiltration. No focal abnormality. Gallbladder unremarkable. Pancreas: No focal abnormality or ductal dilatation. Spleen: No focal abnormality.  Normal size. Adrenals/Urinary Tract: The right upper pole collecting system is mildly prominent likely related to the staghorn calculus. Ureters are decompressed. Foley catheter in place with urinary bladder decompressed. Stomach/Bowel: Stomach, large and small bowel grossly unremarkable. Vascular/Lymphatic: Aortic atherosclerosis. No enlarged abdominal or  pelvic lymph nodes. Reproductive: No visible focal abnormality. Other: No free fluid or free air. Musculoskeletal: No acute bony abnormality. Advanced degenerative facet disease in the lumbar spine. IMPRESSION: Small right pleural effusion. Airspace  disease in the left lower lobe concerning for pneumonia. Fatty infiltration of the liver. Right renal pelvic staghorn calculus. Caliectasis in the upper pole of the right kidney. Ureters are decompressed. Aortic atherosclerosis. Electronically Signed   By: Rolm Baptise M.D.   On: 05/09/2019 20:09   DG Chest Port 1 View  Result Date: 04/29/2019 CLINICAL DATA:  Weakness EXAM: PORTABLE CHEST 1 VIEW COMPARISON:  None. FINDINGS: Heart size and vascularity normal. Lungs are clear. Patient rotated. Subluxation right shoulder may be chronic.  Shallow glenoid fossa. IMPRESSION: No active disease. Electronically Signed   By: Franchot Gallo M.D.   On: 05/11/2019 18:37    ROS  ROS: I have reviewed the patient's review of systems thoroughly and there are no positive responses as relates to the HPI. Blood pressure 91/62, pulse (!) 102, temperature (!) 96.8 F (36 C), temperature source Axillary, resp. rate (!) 22, height _0  (1.702 m), weight 72.8 kg, SpO2 95 %. Physical Exam Well-developed well-nourished patient in no acute distress. Alert and oriented x3 HEENT:within normal limits Cardiac: Regular rate and rhythm Pulmonary: Lungs clear to auscultation Abdomen: Soft and nontender.  Normal active bowel sounds  Musculoskeletal: (Lower extremity has severe brawny edema bilaterally.  There is areas of superficial skin breakdown in particular on the left leg and the lateral aspect.  It does not appear to go deep.  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  My suspicion is is  that he has had chronic brawny edema and most recently has been having some erythema with it.  The skin breakdown is very superficial and looks to be more epidermal lysis than any deep wounds.  The big issue is that this point would be elevation and trying to control any kind of pressure which will be very difficult in the situation the patient finds himself.  The big question is could his sepsis be coming from his lower extremity brawny edema and cellulitis and skin breakdown.  I think the answer is probably no but the bigger question is what would you do about it if it were.  The initial treatment for cellulitis and superficial skin breakdown would be antibiotic therapy.  There is no debridement that would be beneficial to the patient.  If it became thoughtful that the cellulitis was the driver of his sepsis and the only reasonable surgical intervention would be amputation.  This would need to be above-knee amputation.  I am certainly not pushing hard for this and the patient appears to have more significant medical issues and I am not certain that he would survive this surgical intervention.  We are happy to be involved however his primary service would like.  At this point I probably will see him every other day unless the situation deteriorates in which case would be happy to see him more often.  Alta Corning 04/29/2019, 12:33 PM

## 2019-04-29 NOTE — Progress Notes (Signed)
[facility] Progress Note Patient Name: Dustin Wong DOB: 13-Oct-1960 MRN: [id number]   Date of Service  04/29/2019  HPI/Events of Note  Rapid COVID and COVID tests negative.   eICU Interventions  Will D/C air borne precautions.      Intervention Category Major Interventions: Other:  [name] [name] 04/29/2019, 4:45 AM

## 2019-04-30 ENCOUNTER — Ambulatory Visit (HOSPITAL_COMMUNITY): Payer: Medicare Other

## 2019-04-30 MED ORDER — EPINEPHRINE PF 1 MG/ML IJ SOLN: 1.0000 mg | Freq: Once | INTRAMUSCULAR | Status: DC

## 2019-05-01 LAB — URINE CULTURE: Culture: >=100000 — AB

## 2019-05-03 LAB — CULTURE, BLOOD (ROUTINE X 2): Culture: NO GROWTH

## 2019-05-04 ENCOUNTER — Telehealth: Payer: Self-pay

## 2019-05-04 LAB — CULTURE, BLOOD (ROUTINE X 2)
Culture: NO GROWTH
Special Requests: ADEQUATE

## 2019-05-04 NOTE — Telephone Encounter (Signed)
05/04/19 Recd DC from [facility]. Sending to [facility] for Dr. Carlis Abbott to sign.  05/06/19 Recd signed DC from Dr. Carlis Abbott. Faxed copy to [facility] and called them for pickup of original.

## 2019-05-14 NOTE — Progress Notes (Signed)
Dayshift nurse Bailey Mech made me aware of change of code status from full code to partial. Dr. Carlis Abbott also emphasized that patient was not to be intubated, as he was able to verbalize his wishes earlier in the day.

## 2019-05-14 NOTE — Progress Notes (Signed)
On call physician notified of max rate reached on titratable neo with an SBP <90 and MAP<60. On call physician did not make any changes to medication dosage based on patients likely outcome. Nurse was instructed to speak with patients sister, [name] to confirm her families wishes as far as supportive care. I asked charge nurse Gilmer Mor to assist me with this discussion. The patients sister decided at this time, due to poor prognosis to only medication interventions. Physician was notified of changes

## 2019-05-14 NOTE — Progress Notes (Signed)
At 0158, Pt unresponsive and apneic.  Unable to palpate carotid pulse.  DNR status confirmed.  Asystole on monitor and confirmed in two leads.  No breath sounds auscultated for full minute.  No heart sounds auscultated for full minute.  Pupils fixed and dilated.  Confirmed by second registered nurse. Patient pronounced dead at:  1540 by Angelica Rothermel,RN and Silvano Rusk.  Jacqulyn Ducking ICU/SD RN4 / Care Coordinator / Rapid Response Nurse Rapid Response Number:  [phone] ICU Charge Nurse Number:  [phone]

## 2019-05-14 NOTE — Progress Notes (Signed)
Patient's code status is listed as a meds only and defib only.  Talked with Marylu Lund (pt's sister and POA) who is at bedside about plan for what she would like if or when pt's heart stops.    Notified E-link (Dr. Oletta Darter and Genia Hotter) regarding patient pts condition d/t seeing patient's HR and RR decreasing.  After some discussion, Dr. Oletta Darter, [name], and I agreed not to call a code over head d/t not needing ED MD or other staff to respond d/t not intubating and not doing compressions,  Dr. Oletta Darter advised to push the E-link button when we loose a pulse or pt stops breathing and he would advise what meds if needed to push.  Also [name] advised she did not want [place] to ventilate him with the BVM or anything like that.  All [name] wanted was medication only.  Dr. Oletta Darter heard conversation an was ok with this plan.  At [place], pt's heart rate went down to 26 on monitor, unable to feel carotid pulse.  Notified Dr. Oletta Darter of situation.  Verbal order for Epi 1mg  IV given, which was immediately given.  At [place], felt carotid pulse with SBP in 90s.  After a brief discussion with Dr. Oletta Darter, [name], and I, [name] made the decision to change patient to DNR.  Dr. Oletta Darter agreed and entered DNR order in Epic.  Pt is now a DNR.  Jacqulyn Ducking ICU/SD RN4 / Care Coordinator / Rapid Response Nurse Rapid Response Number:  [phone] ICU Charge Nurse Number:  [phone]

## 2019-05-14 NOTE — Discharge Summary (Signed)
Physician Discharge Summary  Patient ID: Dustin Wong MRN: [id number] DOB/AGE: 1960-08-26 59 y.o.  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 intake, and left lower lobe pneumonia.  He initially improved with volume resuscitation and antibiotics, but was progressively having increasing difficulty with secretion management.  Given his chronic disability and limited life expectancy from his muscular dystrophy, goals of care discussions with Dustin Wong and his sister Dustin Wong were pursued throughout his stay.  He declined an NG tube for enteral tube feeds and discouraged aggressive suctioning to assist his airway maintenance and respiratory effort.  He developed progressive hypoxic respiratory failure and shock, and his sister was at bedside when care was ultimately transitioned to fully being comfort based.  He expired overnight on 12/18.  Consults: Orthopedic surgery  Significant Diagnostic Studies: microbiology: blood culture: negative and urine culture: positive for Enterococcus faecalis and radiology: CT scan: Left lower lobe pneumonia  Treatments: IV hydration, antibiotics: ceftriaxone and azithromycin, cardiac meds: Epinephrine and respiratory therapy: O2 and nasotracheal suctioning   Disposition:  morgue   Allergies as of 04/24/2019   No Known Allergies     Medication List    ASK your doctor about these medications    cholecalciferol 25 MCG (1000 UT) tablet Commonly known as: VITAMIN D3 Take 1,000 Units by mouth daily.   furosemide 40 MG tablet Commonly known as: LASIX Take 40 mg by mouth.   multivitamin with minerals Tabs tablet Take 1 tablet by mouth daily.   potassium chloride 10 MEQ tablet Commonly known as: KLOR-CON Take 10 mEq by mouth daily.   Vitamin C Plus 500 MG Tabs Take 500 mg by mouth daily.        Signed: Julian Hy 05/06/2019, 6:24 PM

## 2019-05-14 NOTE — Progress Notes (Signed)
[facility] Progress Note Patient Name: Dustin Wong DOB: 11/16/1960 MRN: [id number]   Date of Service  04/14/2019  HPI/Events of Note  Patient went asystolic and is a limited code. Medications only. No intubation or CPR. Given Epinephrine 1 amp with return of pulse and BP. At his time the patient's sister, [name] who is POA for the patient voiced that she did not want to push medications again and requested that the patient be a full DNR.  eICU Interventions  Will change code status to full DNR/DNI.      Intervention Category Major Interventions: Code management / supervision  Daelen Belvedere [place] 04/18/2019, 1:11 AM

## 2019-05-14 NOTE — Progress Notes (Signed)
Patient placed on non-rebreather mask turned up to 15L due to quickly declining sp02. Non-rebreather was able to bring patients oxygen saturation up to 89%. Patient appeared more comfortable and was able to rest.

## 2019-05-14 DEATH — deceased

## 2020-12-21 IMAGING — CT CT ABD-PELV W/ CM
2 of 5 series · 14 of 42 positions shown, 16 images · IV contrast (omnipaque)
Comparison: None.

CLINICAL DATA: Elevated LFTs, sepsis

EXAM:
CT ABDOMEN AND PELVIS WITH CONTRAST
TECHNIQUE: Multidetector CT imaging of the abdomen and pelvis was performed
using the standard protocol following bolus administration of
intravenous contrast.
CONTRAST:  100mL OMNIPAQUE IOHEXOL 300 MG/ML  SOLN

[Series 5: coronal st · coronal · 0.70mm/px · 3 of 151 slices shown (1 of 2)]
[im 51/151  soft-tissue]
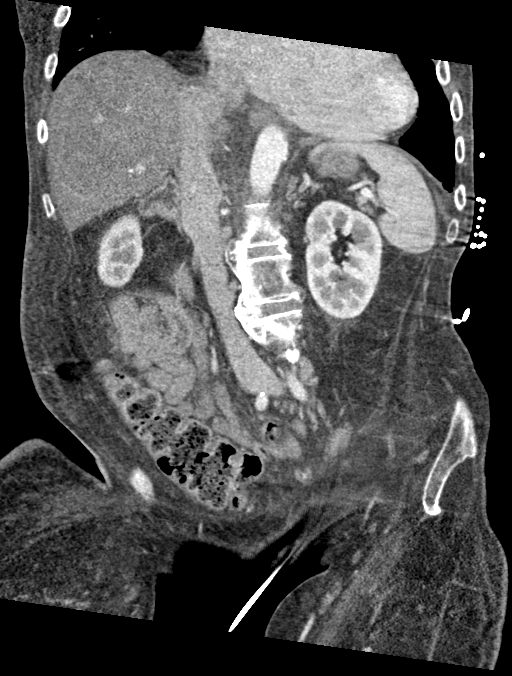
[im 67/151  soft-tissue]
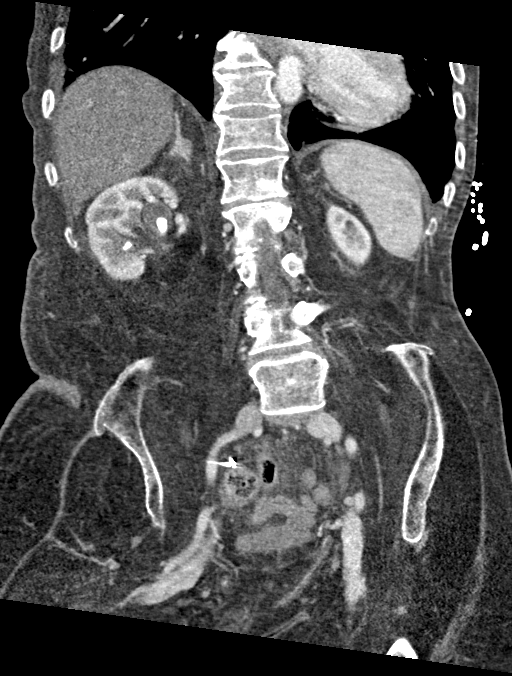
[im 84/151  soft-tissue]
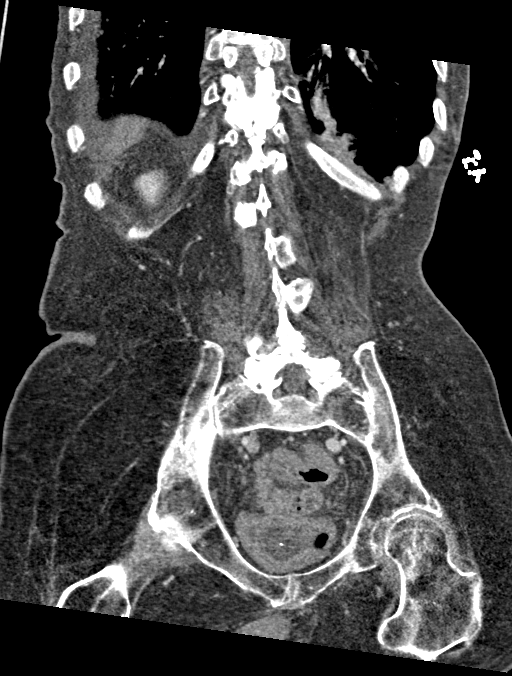

[Series 8: coronal st · axial · 0.59mm/px · z∈[+732,+1148]mm · 11 of 238 slices shown, 13 images (2 of 2)]
[im 14/238  soft-tissue]
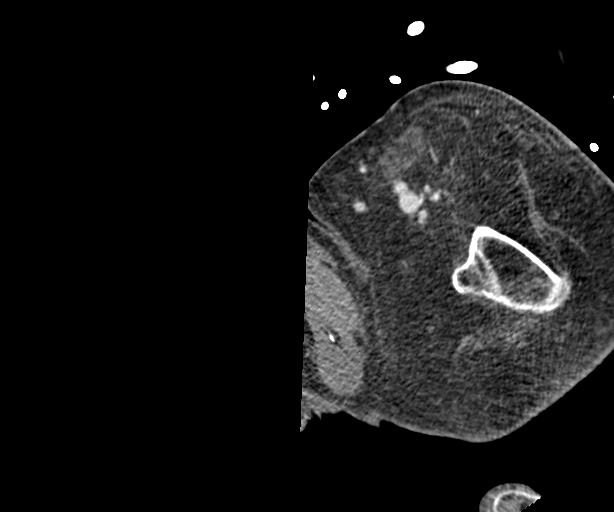
[im 14/238  bone]
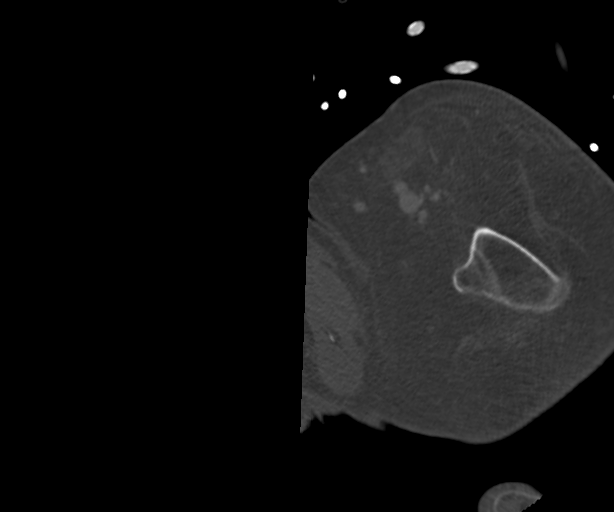
[im 40/238  soft-tissue]
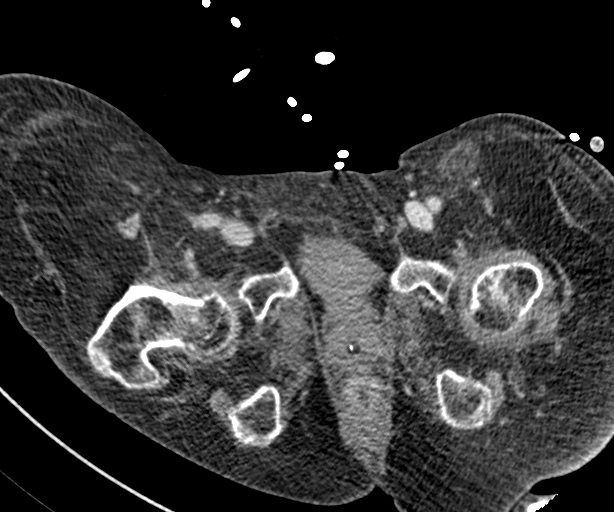
[im 53/238  soft-tissue]
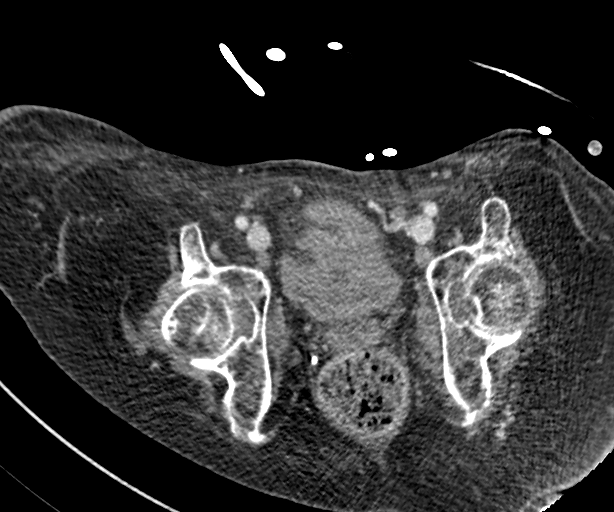
[im 80/238  soft-tissue]
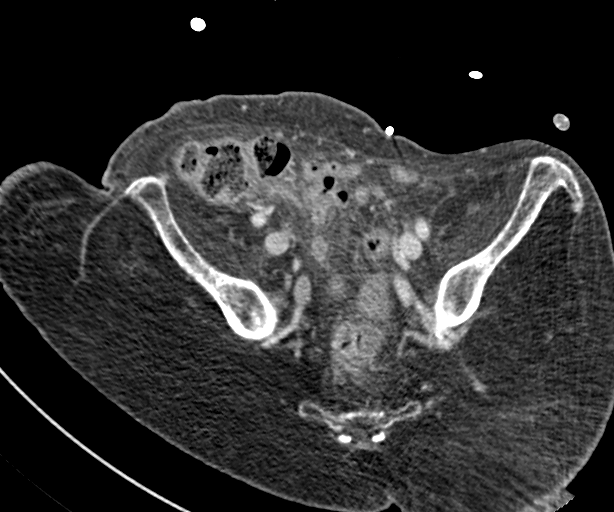
[im 93/238  soft-tissue]
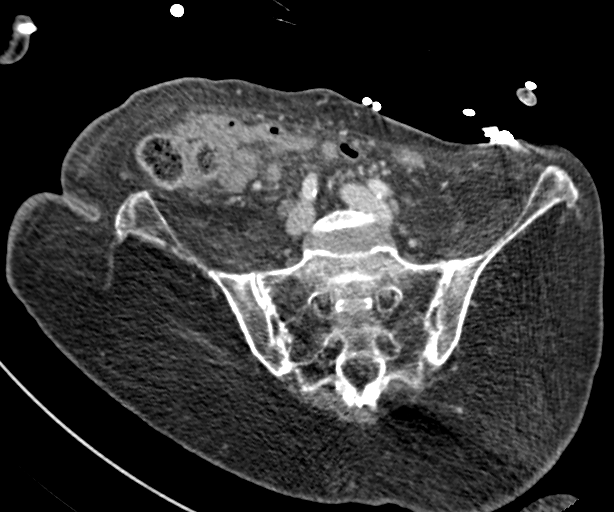
[im 119/238  soft-tissue]
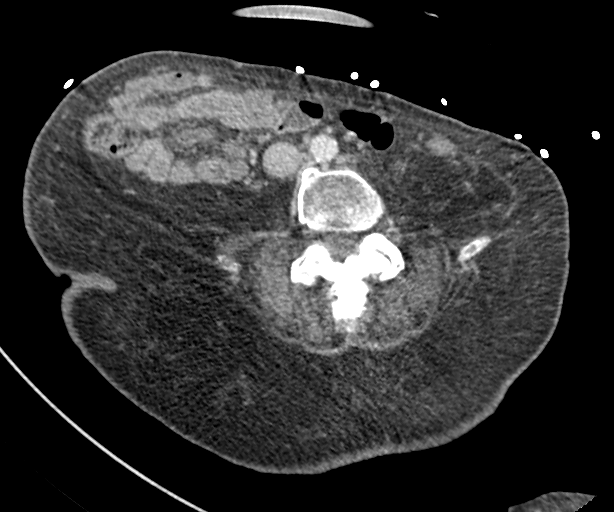
[im 145/238  soft-tissue]
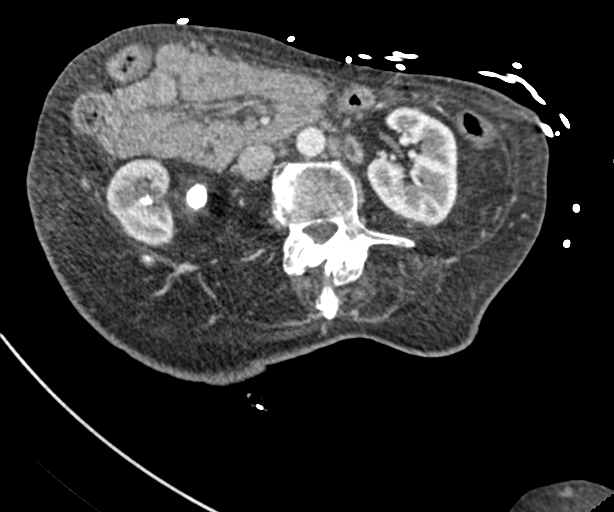
[im 159/238  soft-tissue]
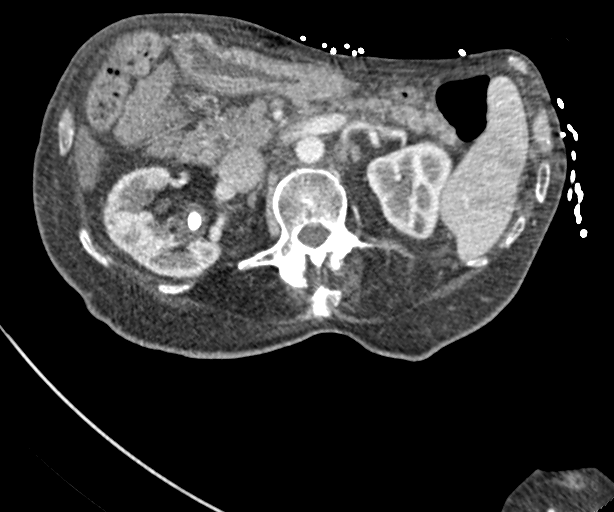
[im 185/238  soft-tissue]
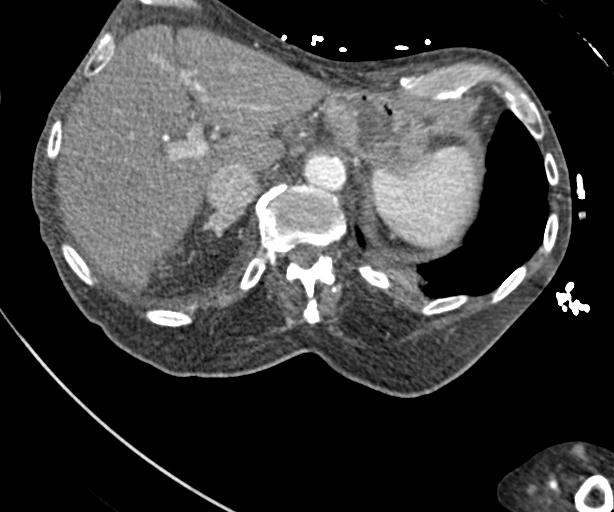
[im 185/238  bone]
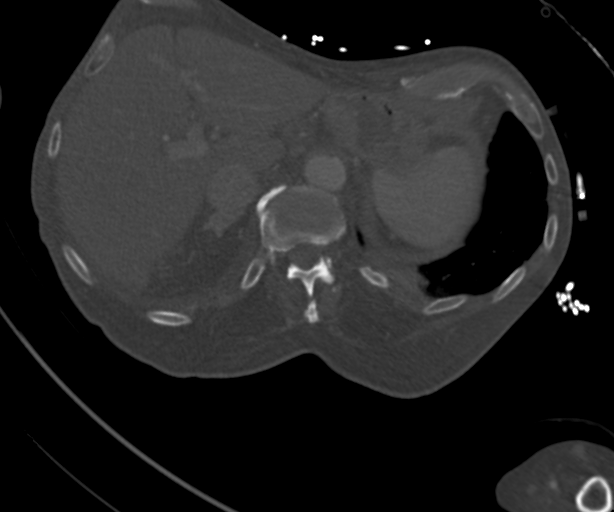
[im 198/238  soft-tissue]
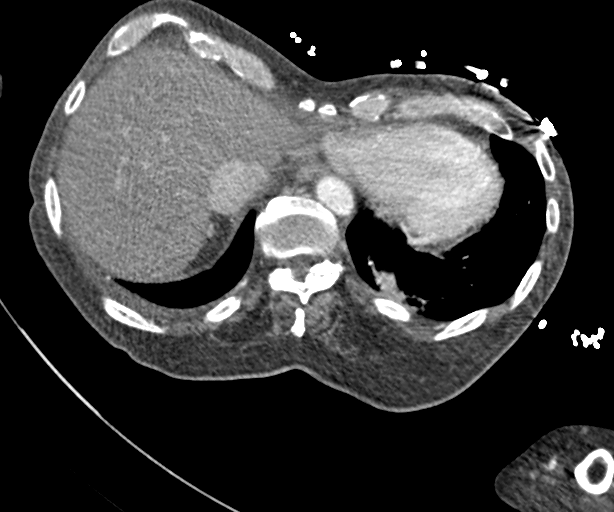
[im 224/238  soft-tissue]
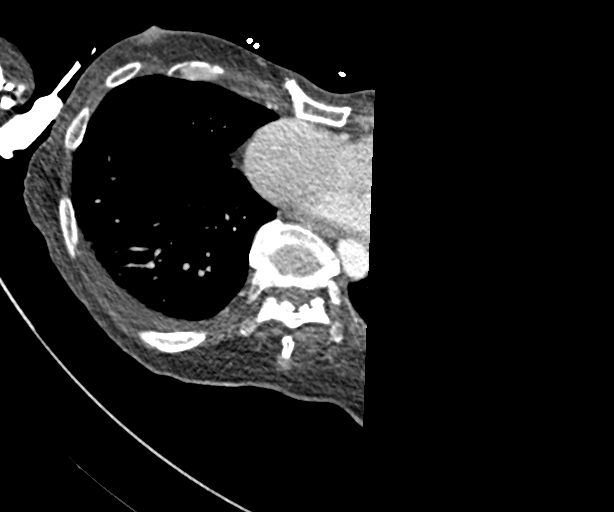

[14 of 42 positions shown; findings below may reference images not displayed]

FINDINGS: Lower chest: Airspace disease in the left lower lobe concerning for
pneumonia. Small right pleural effusion.

Hepatobiliary: Diffuse low-density throughout the liver compatible
with fatty infiltration. No focal abnormality. Gallbladder
unremarkable.

Pancreas: No focal abnormality or ductal dilatation.

Spleen: No focal abnormality.  Normal size.

Adrenals/Urinary Tract: The right upper pole collecting system is
mildly prominent likely related to the staghorn calculus. Ureters
are decompressed. Foley catheter in place with urinary bladder
decompressed.

Stomach/Bowel: Stomach, large and small bowel grossly unremarkable.

Vascular/Lymphatic: Aortic atherosclerosis. No enlarged abdominal or
pelvic lymph nodes.

Reproductive: No visible focal abnormality.

Other: No free fluid or free air.

Musculoskeletal: No acute bony abnormality. Advanced degenerative
facet disease in the lumbar spine.
IMPRESSION: Small right pleural effusion. Airspace disease in the left lower
lobe concerning for pneumonia.

Fatty infiltration of the liver.

Right renal pelvic staghorn calculus. Caliectasis in the upper pole
of the right kidney. Ureters are decompressed.

Aortic atherosclerosis.
